# Patient Record
Sex: Female | Born: 1935 | Race: White | Hispanic: No | Marital: Married | Smoking: Never smoker
Health system: Southern US, Community
[De-identification: ages and names within clinical notes are randomized; demographics above are authoritative.]

## PROBLEM LIST (undated history)

## (undated) DIAGNOSIS — E785 Hyperlipidemia, unspecified: Secondary | ICD-10-CM

## (undated) DIAGNOSIS — N3020 Other chronic cystitis without hematuria: Secondary | ICD-10-CM

## (undated) DIAGNOSIS — M1990 Unspecified osteoarthritis, unspecified site: Secondary | ICD-10-CM

## (undated) DIAGNOSIS — K635 Polyp of colon: Secondary | ICD-10-CM

## (undated) DIAGNOSIS — M069 Rheumatoid arthritis, unspecified: Secondary | ICD-10-CM

## (undated) DIAGNOSIS — Z87898 Personal history of other specified conditions: Secondary | ICD-10-CM

## (undated) DIAGNOSIS — M8580 Other specified disorders of bone density and structure, unspecified site: Secondary | ICD-10-CM

## (undated) DIAGNOSIS — T7840XA Allergy, unspecified, initial encounter: Secondary | ICD-10-CM

## (undated) DIAGNOSIS — M542 Cervicalgia: Secondary | ICD-10-CM

## (undated) DIAGNOSIS — K5790 Diverticulosis of intestine, part unspecified, without perforation or abscess without bleeding: Secondary | ICD-10-CM

## (undated) DIAGNOSIS — I10 Essential (primary) hypertension: Secondary | ICD-10-CM

## (undated) DIAGNOSIS — K760 Fatty (change of) liver, not elsewhere classified: Secondary | ICD-10-CM

## (undated) DIAGNOSIS — G5600 Carpal tunnel syndrome, unspecified upper limb: Secondary | ICD-10-CM

## (undated) DIAGNOSIS — H269 Unspecified cataract: Secondary | ICD-10-CM

## (undated) DIAGNOSIS — E119 Type 2 diabetes mellitus without complications: Secondary | ICD-10-CM

## (undated) DIAGNOSIS — G4733 Obstructive sleep apnea (adult) (pediatric): Secondary | ICD-10-CM

## (undated) HISTORY — DX: Polyp of colon: K63.5

## (undated) HISTORY — DX: Cervicalgia: M54.2

## (undated) HISTORY — PX: DILATION AND CURETTAGE OF UTERUS: SHX78

## (undated) HISTORY — DX: Type 2 diabetes mellitus without complications: E11.9

## (undated) HISTORY — PX: ROTATOR CUFF REPAIR: SHX139

## (undated) HISTORY — PX: OTHER SURGICAL HISTORY: SHX169

## (undated) HISTORY — DX: Obstructive sleep apnea (adult) (pediatric): G47.33

## (undated) HISTORY — DX: Other chronic cystitis without hematuria: N30.20

## (undated) HISTORY — PX: BUNIONECTOMY: SHX129

## (undated) HISTORY — DX: Rheumatoid arthritis, unspecified: M06.9

## (undated) HISTORY — DX: Diverticulosis of intestine, part unspecified, without perforation or abscess without bleeding: K57.90

## (undated) HISTORY — DX: Other specified disorders of bone density and structure, unspecified site: M85.80

## (undated) HISTORY — DX: Unspecified cataract: H26.9

## (undated) HISTORY — DX: Hyperlipidemia, unspecified: E78.5

## (undated) HISTORY — DX: Unspecified osteoarthritis, unspecified site: M19.90

## (undated) HISTORY — DX: Personal history of other specified conditions: Z87.898

## (undated) HISTORY — DX: Carpal tunnel syndrome, unspecified upper limb: G56.00

## (undated) HISTORY — PX: ABDOMINAL SURGERY: SHX537

## (undated) HISTORY — DX: Allergy, unspecified, initial encounter: T78.40XA

## (undated) HISTORY — DX: Fatty (change of) liver, not elsewhere classified: K76.0

---

## 1997-11-27 ENCOUNTER — Other Ambulatory Visit: Admission: RE | Admit: 1997-11-27 | Discharge: 1997-11-27 | Payer: Self-pay | Admitting: Internal Medicine

## 1999-02-02 ENCOUNTER — Ambulatory Visit: Admission: RE | Admit: 1999-02-02 | Discharge: 1999-02-02 | Payer: Self-pay | Admitting: Internal Medicine

## 2000-05-20 ENCOUNTER — Emergency Department (HOSPITAL_COMMUNITY): Admission: EM | Admit: 2000-05-20 | Discharge: 2000-05-20 | Payer: Self-pay | Admitting: Emergency Medicine

## 2000-05-20 ENCOUNTER — Encounter: Payer: Self-pay | Admitting: Emergency Medicine

## 2000-08-19 ENCOUNTER — Encounter (INDEPENDENT_AMBULATORY_CARE_PROVIDER_SITE_OTHER): Payer: Self-pay | Admitting: Specialist

## 2000-08-19 ENCOUNTER — Other Ambulatory Visit: Admission: RE | Admit: 2000-08-19 | Discharge: 2000-08-19 | Payer: Self-pay | Admitting: Gastroenterology

## 2000-08-23 ENCOUNTER — Ambulatory Visit (HOSPITAL_COMMUNITY): Admission: RE | Admit: 2000-08-23 | Discharge: 2000-08-23 | Payer: Self-pay | Admitting: Gastroenterology

## 2000-08-23 ENCOUNTER — Encounter: Payer: Self-pay | Admitting: Gastroenterology

## 2002-11-27 ENCOUNTER — Encounter: Payer: Self-pay | Admitting: Emergency Medicine

## 2002-11-27 ENCOUNTER — Emergency Department (HOSPITAL_COMMUNITY): Admission: EM | Admit: 2002-11-27 | Discharge: 2002-11-27 | Payer: Self-pay | Admitting: Emergency Medicine

## 2004-04-25 ENCOUNTER — Encounter: Admission: RE | Admit: 2004-04-25 | Discharge: 2004-04-25 | Payer: Self-pay | Admitting: Internal Medicine

## 2004-05-06 ENCOUNTER — Encounter: Admission: RE | Admit: 2004-05-06 | Discharge: 2004-05-06 | Payer: Self-pay | Admitting: Internal Medicine

## 2004-05-08 ENCOUNTER — Encounter: Admission: RE | Admit: 2004-05-08 | Discharge: 2004-05-08 | Payer: Self-pay | Admitting: Internal Medicine

## 2004-05-19 ENCOUNTER — Encounter: Admission: RE | Admit: 2004-05-19 | Discharge: 2004-05-19 | Payer: Self-pay | Admitting: General Surgery

## 2004-06-05 ENCOUNTER — Inpatient Hospital Stay (HOSPITAL_COMMUNITY): Admission: RE | Admit: 2004-06-05 | Discharge: 2004-06-08 | Payer: Self-pay | Admitting: General Surgery

## 2004-06-05 ENCOUNTER — Encounter (INDEPENDENT_AMBULATORY_CARE_PROVIDER_SITE_OTHER): Payer: Self-pay | Admitting: *Deleted

## 2008-07-13 ENCOUNTER — Encounter (INDEPENDENT_AMBULATORY_CARE_PROVIDER_SITE_OTHER): Payer: Self-pay | Admitting: *Deleted

## 2008-09-11 ENCOUNTER — Ambulatory Visit: Payer: Self-pay | Admitting: Gastroenterology

## 2008-09-25 ENCOUNTER — Encounter: Payer: Self-pay | Admitting: Gastroenterology

## 2008-09-25 ENCOUNTER — Ambulatory Visit: Payer: Self-pay | Admitting: Gastroenterology

## 2008-09-27 ENCOUNTER — Encounter: Payer: Self-pay | Admitting: Gastroenterology

## 2010-03-30 HISTORY — PX: OTHER SURGICAL HISTORY: SHX169

## 2010-07-30 ENCOUNTER — Other Ambulatory Visit (HOSPITAL_COMMUNITY): Payer: Self-pay | Admitting: Radiology

## 2010-07-30 ENCOUNTER — Ambulatory Visit (HOSPITAL_COMMUNITY): Payer: Medicare Other | Attending: Internal Medicine | Admitting: Radiology

## 2010-07-30 DIAGNOSIS — I517 Cardiomegaly: Secondary | ICD-10-CM

## 2010-07-30 DIAGNOSIS — I10 Essential (primary) hypertension: Secondary | ICD-10-CM

## 2010-08-15 NOTE — Discharge Summary (Signed)
Laura Maxwell, Laura Maxwell               ACCOUNT NO.:  [id number]   MEDICAL RECORD NO.:  [id number]          PATIENT TYPE:  INP   LOCATION:  5732                         FACILITY:  [facility]   PHYSICIAN:  Gabrielle Dare. Janee Morn, M.D.DATE OF BIRTH:  09-27-1935   DATE OF ADMISSION:  06/05/2004  DATE OF DISCHARGE:  06/08/2004                                 DISCHARGE SUMMARY   DISCHARGE DIAGNOSIS:  Status post resection of pelvic leiomyoma.   HISTORY OF PRESENT ILLNESS:  Patient is a 75 year old white female who  presented for elective resection of pelvic mass.  This mass had been  diagnosed through work-ups for recurrent urinary tract infections.   HOSPITAL COURSE:  The patient came in and underwent an uncomplicated  resection of a pelvic mass.  Postoperatively she had mild ileus.  This  resolved gradually.  She remained afebrile and hemodynamically stable.  Her  pathology report came back a leiomyoma with no evidence of malignancy.  She  continued to progress well and otherwise had an uncomplicated postoperative  course.  She was discharged home on postoperative day #3.   DISCHARGE DIET:  Regular.   DISCHARGE ACTIVITY:  No lifting.   DISCHARGE MEDICATIONS:  1.  Percocet 5/325 one to two q.6h. as needed for pain.  2.  She is also to continue her home medications of Accupril 40 mg daily.  3.  Hydrochlorothiazide 25 mg daily.  4.  acebutolol 200 mg daily.   FOLLOW-UP:  In my office next week for staple removal.      BET/MEDQ  D:  08/01/2004  T:  08/01/2004  Job:  [id number]

## 2010-08-15 NOTE — Op Note (Signed)
NAME:  Laura Maxwell, Laura Maxwell               ACCOUNT NO.:  [id number]   MEDICAL RECORD NO.:  [id number]          PATIENT TYPE:  INP   LOCATION:  2550                         FACILITY:  [facility]   PHYSICIAN:  Burke E. Thompson, M.D.DATE OF BIRTH:  02/04/1936   DATE OF PROCEDURE:  06/05/2004  DATE OF DISCHARGE:                                 OPERATIVE REPORT   PREOPERATIVE DIAGNOSIS:  Pelvic mass.   POSTOPERATIVE DIAGNOSIS:  Pelvic mass.   PROCEDURE:  Resection of pelvic mass.   SURGEON:  Burke E. Thompson, M.D.   ASSISTANT:  Todd J. Rosenbower, M.D.   ANESTHESIA:  General.   HISTORY OF PRESENT ILLNESS:  The patient is a 75-year-old white female who I  evaluated for a mass in her left pelvis.  This was seen first CAT scan of  the abdomen and pelvis that was obtained for recurring urinary tract  infections.  Further workup included MRI which demonstrated soft tissue mass  within the left hemi pelvis separate from the ovary and of the uterus but  projecting anteriorly from the region of the round ligament.  It was at 7.8  x 5.5 cm. The patient presents for resection of the mass. She did undergo  bowel prep for the surgery.   PROCEDURE IN DETAIL:  Informed consent was obtained, the patient was  identified, she received intravenous antibiotics. She was brought to the  operating room. General anesthesia administered. Her abdomen was prepped and  draped in sterile fashion. A lower midline incision was made.  The  subcutaneous tissues were dissected down revealing the anterior fascia which  was divided.  The posterior fascia was divided and the peritoneal cavity was  entered under direct vision without difficulty.  The fascia was opened to  the length of the incision.  Exploration of the pelvis revealed this 8 cm  mass to be arising from the peritoneum on a proximally 2 cm peritoneal  peduncle with blood was obtained with.  The mass was clamped at its base  after dividing some of filmy portion  of the of the stalk with Bovie cautery  and it was excised in toto and sent to pathology the base was tied with 2-0  silk suture and cauterized.  We had excellent hemostasis.  The abdomen was  then further explored.  The peritoneal surface was palpated and no other  peritoneal nodules were felt.  The liver was smooth on the right and left  sides. The small bowel from ligament of Treitz to the terminal ileum and was  normal.  No other pelvic masses were palpable. No other abnormalities were  noted.  Hemostasis was ensured.  The area was locally irrigated the abdomen  was closed in the following manner. The abdomen was closed as follows. The  fascia was closed with two lengths of running #1 PDS suture starting at each  end of the incision and tied in the middle. Subcutaneous tissues were again  copiously  irrigated. Hemostasis was ensured and the skin was closed with staples.  Sponge, needle and instrument counts were correct. A sterile dressing was  applied.   The patient tolerated the procedure well without apparent  complications and was taken to recovery room in stable condition.      BET/MEDQ  D:  06/05/2004  T:  06/05/2004  Job:  [id number] 

## 2010-08-15 NOTE — Op Note (Signed)
Laura Maxwell, Laura Maxwell               ACCOUNT NO.:  [id number]   MEDICAL RECORD NO.:  [id number]          PATIENT TYPE:  INP   LOCATION:  2550                         FACILITY:  [facility]   PHYSICIAN:  Gabrielle Dare. Janee Morn, M.D.DATE OF BIRTH:  Feb 26, 1936   DATE OF PROCEDURE:  06/05/2004  DATE OF DISCHARGE:                                 OPERATIVE REPORT   PREOPERATIVE DIAGNOSIS:  Pelvic mass.   POSTOPERATIVE DIAGNOSIS:  Pelvic mass.   PROCEDURE:  Resection of pelvic mass.   SURGEON:  Gabrielle Dare. Janee Morn, M.D.   ASSISTANT:  Adolph Pollack, M.D.   ANESTHESIA:  General.   HISTORY OF PRESENT ILLNESS:  The patient is a 75 year old white female who I  evaluated for a mass in her left pelvis.  This was seen first CAT scan of  the abdomen and pelvis that was obtained for recurring urinary tract  infections.  Further workup included MRI which demonstrated soft tissue mass  within the left hemi pelvis separate from the ovary and of the uterus but  projecting anteriorly from the region of the round ligament.  It was at 7.8  x 5.5 cm. The patient presents for resection of the mass. She did undergo  bowel prep for the surgery.   PROCEDURE IN DETAIL:  Informed consent was obtained, the patient was  identified, she received intravenous antibiotics. She was brought to the  operating room. General anesthesia administered. Her abdomen was prepped and  draped in sterile fashion. A lower midline incision was made.  The  subcutaneous tissues were dissected down revealing the anterior fascia which  was divided.  The posterior fascia was divided and the peritoneal cavity was  entered under direct vision without difficulty.  The fascia was opened to  the length of the incision.  Exploration of the pelvis revealed this 8 cm  mass to be arising from the peritoneum on a proximally 2 cm peritoneal  peduncle with blood was obtained with.  The mass was clamped at its base  after dividing some of filmy portion  of the of the stalk with Bovie cautery  and it was excised in toto and sent to pathology the base was tied with 2-0  silk suture and cauterized.  We had excellent hemostasis.  The abdomen was  then further explored.  The peritoneal surface was palpated and no other  peritoneal nodules were felt.  The liver was smooth on the right and left  sides. The small bowel from ligament of Treitz to the terminal ileum and was  normal.  No other pelvic masses were palpable. No other abnormalities were  noted.  Hemostasis was ensured.  The area was locally irrigated the abdomen  was closed in the following manner. The abdomen was closed as follows. The  fascia was closed with two lengths of running #1 PDS suture starting at each  end of the incision and tied in the middle. Subcutaneous tissues were again  copiously  irrigated. Hemostasis was ensured and the skin was closed with staples.  Sponge, needle and instrument counts were correct. A sterile dressing was  applied.  The patient tolerated the procedure well without apparent  complications and was taken to recovery room in stable condition.      BET/MEDQ  D:  06/05/2004  T:  06/05/2004  Job:  [id number]

## 2010-10-07 ENCOUNTER — Encounter (INDEPENDENT_AMBULATORY_CARE_PROVIDER_SITE_OTHER): Payer: Medicare Other

## 2010-10-07 DIAGNOSIS — R002 Palpitations: Secondary | ICD-10-CM

## 2011-10-05 ENCOUNTER — Emergency Department (HOSPITAL_COMMUNITY)
Admission: EM | Admit: 2011-10-05 | Discharge: 2011-10-05 | Disposition: A | Discharge status: Home / Self Care | Payer: Medicare Other | Attending: Emergency Medicine | Admitting: Emergency Medicine

## 2011-10-05 ENCOUNTER — Encounter (HOSPITAL_COMMUNITY): Payer: Self-pay | Admitting: Emergency Medicine

## 2011-10-05 DIAGNOSIS — T783XXA Angioneurotic edema, initial encounter: Secondary | ICD-10-CM

## 2011-10-05 DIAGNOSIS — X58XXXA Exposure to other specified factors, initial encounter: Secondary | ICD-10-CM | POA: Insufficient documentation

## 2011-10-05 DIAGNOSIS — I10 Essential (primary) hypertension: Secondary | ICD-10-CM | POA: Insufficient documentation

## 2011-10-05 DIAGNOSIS — Z79899 Other long term (current) drug therapy: Secondary | ICD-10-CM | POA: Insufficient documentation

## 2011-10-05 HISTORY — DX: Essential (primary) hypertension: I10

## 2011-10-05 MED ORDER — METHYLPREDNISOLONE SODIUM SUCC 125 MG IJ SOLR
INTRAMUSCULAR | Status: AC
  Administered 2011-10-05: 125 mg via INTRAVENOUS
  Filled 2011-10-05: qty 2

## 2011-10-05 MED ORDER — FAMOTIDINE IN NACL 20-0.9 MG/50ML-% IV SOLN
20.0000 mg | Freq: Once | INTRAVENOUS | Status: AC
  Administered 2011-10-05: 20 mg via INTRAVENOUS

## 2011-10-05 MED ORDER — METHYLPREDNISOLONE SODIUM SUCC 125 MG IJ SOLR
125.0000 mg | Freq: Once | INTRAMUSCULAR | Status: AC
  Administered 2011-10-05: 125 mg via INTRAVENOUS

## 2011-10-05 MED ORDER — DIPHENHYDRAMINE HCL 50 MG/ML IJ SOLN
INTRAMUSCULAR | Status: AC
  Administered 2011-10-05: 25 mg via INTRAVENOUS
  Filled 2011-10-05: qty 1

## 2011-10-05 MED ORDER — FAMOTIDINE IN NACL 20-0.9 MG/50ML-% IV SOLN
INTRAVENOUS | Status: AC
  Administered 2011-10-05: 20 mg via INTRAVENOUS
  Filled 2011-10-05: qty 50

## 2011-10-05 MED ORDER — DIPHENHYDRAMINE HCL 50 MG/ML IJ SOLN
25.0000 mg | Freq: Once | INTRAMUSCULAR | Status: AC
  Administered 2011-10-05: 25 mg via INTRAVENOUS

## 2011-10-05 NOTE — ED Notes (Signed)
Pt. States that it feels like her tongue is decreasing in size. md aware.

## 2011-10-05 NOTE — ED Notes (Signed)
I advised the patient and her spouse to let me know immediately if her swelling gets worse, if she has trouble handling her saliva, or if she feels her throat closing.

## 2011-10-05 NOTE — ED Notes (Signed)
Dr. Norlene Campbell and RN informed of pt roomed from Nurse first r/t potential for airway obstruction from angioedema of tongue

## 2011-10-05 NOTE — ED Provider Notes (Signed)
11:52 AM Patient states that she is substantially improved.  No e/o airway compromise.  D/C home w instructions to stop ACE inhibitor.  Gerhard Munch, MD 10/05/11 1152

## 2011-10-05 NOTE — ED Notes (Signed)
Family at bedside. 

## 2011-10-05 NOTE — ED Notes (Signed)
MD at bedside. 

## 2011-10-05 NOTE — ED Notes (Addendum)
Patient reports gradual swelling of the tongue and face that started after she took her nighttime medication dose.  Obvious swelling to tongue noted; airway clear.  No respiratory issues at this time; patient reports difficulty swallowing.  RN at bedside starting IV.

## 2011-10-05 NOTE — ED Provider Notes (Signed)
History     CSN: [id number]  Arrival date & time 10/05/11  0040   First MD Initiated Contact with Patient 10/05/11 [phone]      Chief Complaint  Patient presents with  . Allergic Reaction  . Angioedema    (Consider location/radiation/quality/duration/timing/severity/associated sxs/prior treatment) HPI 76 year old female presents to emergency room complaining of tongue swelling. Patient reports she was taking her nighttime dosing when she noticed swelling to left side of her tongue. Since onset, she has had increasing swelling. She denies any shortness of breath, difficulties with her oral secretions,, gagging, or other complaints. No prior history of age edema. Patient is on ACE inhibitor, which she's been on for years.  Past Medical History  Diagnosis Date  . Hypertension     History reviewed. No pertinent past surgical history.  History reviewed. No pertinent family history.  History  Substance Use Topics  . Smoking status: Never Smoker   . Smokeless tobacco: Not on file  . Alcohol Use: No    OB History    Grav Para Term Preterm Abortions TAB SAB Ect Mult Living                  Review of Systems  All other systems reviewed and are negative.    Allergies  Ace inhibitors and Azo  Home Medications   Current Outpatient Rx  Name Route Sig Dispense Refill  . ACEBUTOLOL HCL PO Oral Take 1 capsule by mouth 3 (three) times daily.    Marland Kitchen CALCIUM PO Oral Take 1 tablet by mouth daily.    Marland Kitchen HYDROCHLOROTHIAZIDE 25 MG PO TABS Oral Take 25 mg by mouth daily.    . QUINAPRIL HCL 40 MG PO TABS Oral Take 40 mg by mouth daily.    Marland Kitchen ROSUVASTATIN CALCIUM 5 MG PO TABS Oral Take 5 mg by mouth daily.    . OMEGA 3 PO Oral Take 1 capsule by mouth daily.      BP 171/84  Pulse 80  Temp 97.6 F (36.4 C) (Oral)  Resp 15  SpO2 95%  Physical Exam  Nursing note and vitals reviewed. Constitutional: She is oriented to person, place, and time. She appears well-developed and  well-nourished.  HENT:  Head: Normocephalic and atraumatic.  Nose: Nose normal.       angioedema of the tongue noted. Swelling to tip and left half of the tongue without obstruction of the airway. Patient able to swallow. No stridor noted.  Eyes: Conjunctivae and EOM are normal. Pupils are equal, round, and reactive to light.  Neck: Normal range of motion. Neck supple. No JVD present. No tracheal deviation present. No thyromegaly present.  Cardiovascular: Normal rate, regular rhythm, normal heart sounds and intact distal pulses.  Exam reveals no gallop and no friction rub.   No murmur heard. Pulmonary/Chest: Effort normal and breath sounds normal. No stridor. No respiratory distress. She has no wheezes. She has no rales. She exhibits no tenderness.  Abdominal: Soft. Bowel sounds are normal. She exhibits no distension and no mass. There is no tenderness. There is no rebound and no guarding.  Musculoskeletal: Normal range of motion. She exhibits no edema and no tenderness.  Lymphadenopathy:    She has no cervical adenopathy.  Neurological: She is oriented to person, place, and time. She exhibits normal muscle tone. Coordination normal.  Skin: Skin is dry. No rash noted. No erythema. No pallor.  Psychiatric: She has a normal mood and affect. Her behavior is normal. Judgment and thought content  normal.    ED Course  Procedures (including critical care time)  Labs Reviewed - No data to display No results found. CRITICAL CARE Performed by: Olivia Mackie   Total critical care time: 70  Critical care time was exclusive of separately billable procedures and treating other patients.  Critical care was necessary to treat or prevent imminent or life-threatening deterioration.  Critical care was time spent personally by me on the following activities: development of treatment plan with patient and/or surrogate as well as nursing, discussions with consultants, evaluation of patient's response to  treatment, examination of patient, obtaining history from patient or surrogate, ordering and performing treatments and interventions, ordering and review of laboratory studies, ordering and review of radiographic studies, pulse oximetry and re-evaluation of patient's condition.  1. Angioedema       MDM  76 year old female with angioedema most likely due to to ACE inhibitor. After presentation with swelling to left half of tongue, patient developed swelling to the entire tongue. Swelling did progress, and initial view of oropharynx was blocked to just the most appear portion of the uvula. At no point has patient had difficulties handling secretions, shortness of breath stridor or other signs of impending airway obstruction. Patient has been closely monitored during her stay in the emergency department with frequent checks. Patient has been instructed to push the call bell should she have any worsening of her symptoms. At time of end of my shift, patient's tongue has begun to decrease in size slightly. Care passed to Dr. Jeraldine Loots awaiting continued improvement        Olivia Mackie, MD 10/05/11 [phone]

## 2012-03-10 ENCOUNTER — Other Ambulatory Visit: Payer: Self-pay | Admitting: Dermatology

## 2012-08-09 ENCOUNTER — Other Ambulatory Visit: Payer: Self-pay | Admitting: Obstetrics and Gynecology

## 2013-01-20 ENCOUNTER — Encounter (HOSPITAL_COMMUNITY): Payer: Self-pay | Admitting: Emergency Medicine

## 2013-01-20 ENCOUNTER — Observation Stay (HOSPITAL_COMMUNITY)
Admission: EM | Admit: 2013-01-20 | Discharge: 2013-01-20 | Disposition: A | Discharge status: Home / Self Care | Payer: Medicare PPO | Attending: Internal Medicine | Admitting: Internal Medicine

## 2013-01-20 DIAGNOSIS — T783XXA Angioneurotic edema, initial encounter: Principal | ICD-10-CM

## 2013-01-20 DIAGNOSIS — I10 Essential (primary) hypertension: Secondary | ICD-10-CM | POA: Insufficient documentation

## 2013-01-20 DIAGNOSIS — X58XXXA Exposure to other specified factors, initial encounter: Secondary | ICD-10-CM | POA: Insufficient documentation

## 2013-01-20 DIAGNOSIS — Z79899 Other long term (current) drug therapy: Secondary | ICD-10-CM | POA: Insufficient documentation

## 2013-01-20 LAB — MRSA PCR SCREENING: MRSA by PCR: NEGATIVE

## 2013-01-20 LAB — POCT I-STAT, CHEM 8
Calcium, Ion: 1.23 mmol/L (ref 1.13–1.30)
Chloride: 104 mEq/L (ref 96–112)
Creatinine, Ser: 1.2 mg/dL — ABNORMAL HIGH (ref 0.50–1.10)
Potassium: 3.8 mEq/L (ref 3.5–5.1)
TCO2: 23 mmol/L (ref 0–100)

## 2013-01-20 LAB — CBC
HCT: 42.4 % (ref 36.0–46.0)
Hemoglobin: 14.4 g/dL (ref 12.0–15.0)
MCV: 93.8 fL (ref 78.0–100.0)
WBC: 10.3 10*3/uL (ref 4.0–10.5)

## 2013-01-20 LAB — IGE: IgE (Immunoglobulin E), Serum: 8.6 IU/mL (ref 0.0–180.0)

## 2013-01-20 MED ORDER — METHYLPREDNISOLONE SODIUM SUCC 125 MG IJ SOLR
60.0000 mg | Freq: Four times a day (QID) | INTRAMUSCULAR | Status: AC
  Administered 2013-01-20 (×2): 60 mg via INTRAVENOUS
  Filled 2013-01-20: qty 2
  Filled 2013-01-20 (×4): qty 0.96

## 2013-01-20 MED ORDER — METHYLPREDNISOLONE SODIUM SUCC 125 MG IJ SOLR
125.0000 mg | Freq: Once | INTRAMUSCULAR | Status: AC
  Administered 2013-01-20: 125 mg via INTRAVENOUS
  Filled 2013-01-20: qty 2

## 2013-01-20 MED ORDER — EPINEPHRINE 0.3 MG/0.3ML IJ SOAJ: 0.3000 mg | Freq: Once | INTRAMUSCULAR | Status: DC

## 2013-01-20 MED ORDER — DIPHENHYDRAMINE HCL 25 MG PO CAPS: 25.0000 mg | ORAL_CAPSULE | Freq: Four times a day (QID) | ORAL | Status: DC | PRN

## 2013-01-20 MED ORDER — INFLUENZA VAC SPLIT QUAD 0.5 ML IM SUSP
0.5000 mL | Freq: Once | INTRAMUSCULAR | Status: DC
  Filled 2013-01-20: qty 0.5

## 2013-01-20 MED ORDER — FAMOTIDINE 20 MG PO TABS
20.0000 mg | ORAL_TABLET | Freq: Once | ORAL | Status: AC
  Administered 2013-01-20: 20 mg via ORAL
  Filled 2013-01-20: qty 1

## 2013-01-20 MED ORDER — LABETALOL HCL 300 MG PO TABS
300.0000 mg | ORAL_TABLET | Freq: Three times a day (TID) | ORAL | Status: DC
  Administered 2013-01-20 (×2): 300 mg via ORAL
  Filled 2013-01-20 (×4): qty 1

## 2013-01-20 MED ORDER — FAMOTIDINE 20 MG PO TABS: 20.0000 mg | ORAL_TABLET | Freq: Every day | ORAL | Status: DC

## 2013-01-20 MED ORDER — DIPHENHYDRAMINE HCL 50 MG/ML IJ SOLN: 25.0000 mg | Freq: Four times a day (QID) | INTRAMUSCULAR | Status: DC | PRN

## 2013-01-20 MED ORDER — AMLODIPINE BESYLATE 5 MG PO TABS
5.0000 mg | ORAL_TABLET | Freq: Every day | ORAL | Status: DC
Start: 2013-01-20 — End: 2013-01-20
  Administered 2013-01-20: 5 mg via ORAL
  Filled 2013-01-20 (×2): qty 1

## 2013-01-20 MED ORDER — ENOXAPARIN SODIUM 40 MG/0.4ML ~~LOC~~ SOLN
40.0000 mg | SUBCUTANEOUS | Status: DC
  Administered 2013-01-20: 40 mg via SUBCUTANEOUS
  Filled 2013-01-20 (×3): qty 0.4

## 2013-01-20 MED ORDER — FAMOTIDINE IN NACL 20-0.9 MG/50ML-% IV SOLN
20.0000 mg | Freq: Two times a day (BID) | INTRAVENOUS | Status: DC
  Filled 2013-01-20: qty 50

## 2013-01-20 MED ORDER — INFLUENZA VAC SPLIT QUAD 0.5 ML IM SUSP: 0.5000 mL | INTRAMUSCULAR | Status: DC

## 2013-01-20 NOTE — ED Notes (Signed)
Pt. woke up this morning with tongue swelling / airway and respirations unlabored at arrival . Denies pain . No new medications or any new food intake .

## 2013-01-20 NOTE — ED Notes (Signed)
Family at bedside. 

## 2013-01-20 NOTE — Progress Notes (Signed)
I called her and she is sounding completely normal and she says her tongue swelling is way down. i will see her after work and re-eval.

## 2013-01-20 NOTE — H&P (Addendum)
Laura Maxwell is an 77 y.o. female.   PCP:   Laura Pounds, MD   Chief Complaint:  Angioedema  HPI: [name] c known ACEi Angioedema - has been off Lisinopril for awhile presents c tongue swelling on awakening this am.  No airway issues.  Mild to Mod swallowing issues due to tongue size.  No new meds or chemicals.  No shrimp. She did have a Snickers ice cream last night - Peanuts +.  Tongue swelling is R sided.  Lower lip mildly swollen. Mild Throat sensations.  No Hives, stridor, or wheezing. She took 3 Benadryl prior to arrival and had solumedrol, pepcid in ED. Called IM for admission. No SOB or CP or other issues. She reports that one day last week she had > 12 hrs of Upper lip swelling - Minor - Took Bendryl and slowly improved. Has been on Trimethoprim from Laura Maxwell at [facility] for @ one month to avoid UTIs and scheduled to stay on until Dec.  Re-eval 20 min after first eval and speech is less garbled - tongue still swollen but seems less?  No recent Sting bite per insect.  2-3 months of leg itching but improving.   Past Medical History:  Past Medical History  Diagnosis Date  . Hypertension    Hypertension,  Hyperlipidemia,  IFG,  Palpitations,   Recurrent colon polyps,   Diverticulosis,   Mild OSA,  Osteopenia,  hyperglycemia/IFG,  CTS,  OA,  Obesity,  Chronic Cystitis/ Painless Micro-Hematuria - Dr Laura Maxwell  - 2000 Renal US IVP and Cysto all (-) 2007 CT and Cytology (-),  Mult UTIs - On trimethoprim for @ 1 month,  Fatty Liver,  Neck Aches/HA/Scalp Issues,  H/O Vertigo  October 05, 2011 Angioedema to ACEi - presumed.   Past Surgical History  Procedure Laterality Date  . Abdominal surgery    . Dilation and curettage of uterus    . Rotator cuff repair        Allergies:   Allergies  Allergen Reactions  . Ace Inhibitors Swelling    The patient experienced angioedema after taking quinapril.  She states that she had been taking this medicine "for years" without any side effects/adverse  reactions.  This may also have been caused by the Azo she took.  Laura Maxwell [Phenazopyridine] Swelling    The patient started taking this the week before her reaction.  Her last dose was 4 days prior to the angioedema.  This may also have been cause be her ace inhibitor.     Medications: Prior to Admission medications   Medication Sig Start Date End Date Taking? Authorizing Provider  amLODipine (NORVASC) 5 MG tablet Take 5 mg by mouth daily.   Yes Historical Provider, MD  aspirin EC 81 MG tablet Take 81 mg by mouth daily.   Yes Historical Provider, MD  Coenzyme Q10 (COQ10 PO) Take 1 tablet by mouth daily.   Yes Historical Provider, MD  GARLIC PO Take 1 tablet by mouth daily.   Yes Historical Provider, MD  labetalol (NORMODYNE) 300 MG tablet Take 300 mg by mouth 3 (three) times daily.   Yes Historical Provider, MD  Multiple Vitamin (MULTIVITAMIN WITH MINERALS) TABS tablet Take 1 tablet by mouth daily.   Yes Historical Provider, MD  Probiotic Product (ALIGN PO) Take 1 tablet by mouth daily.   Yes Historical Provider, MD  rosuvastatin (CRESTOR) 5 MG tablet Take 5 mg by mouth daily.   Yes Historical Provider, MD  spironolactone (ALDACTONE) 25 MG  tablet Take 25 mg by mouth daily.  01/02/13  Yes Historical Provider, MD  trimethoprim (TRIMPEX) 100 MG tablet Take 100 mg by mouth daily.  01/13/13  Yes Historical Provider, MD  VITAMIN E PO Take 1 tablet by mouth daily.   Yes Historical Provider, MD      (Not in a hospital admission)   Social History:  reports that she has never smoked. She does not have any smokeless tobacco history on file. She reports that she does not drink alcohol or use illicit drugs.  Family History: No family history on file.  Review of Systems:  Review of Systems - See HPI. All Systems reviewed.   Physical Exam:  Blood pressure 137/73, pulse 69, temperature 98 F (36.7 C), temperature source Oral, resp. rate 14, SpO2 97.00%. Filed Vitals:   01/20/13 0333 01/20/13  0500 01/20/13 0613  BP: 194/94 152/76 137/73  Pulse: 91 73 69  Temp: 98 F (36.7 C)    TempSrc: Oral    Resp: 16 14 14   SpO2: 96% 97% 97%   General appearance: Tired but looks  Head: Normocephalic, without obvious abnormality, atraumatic Eyes: conjunctivae/corneas clear. PERRL, EOM's intact.  Nose: Nares normal. Septum midline. Mucosa normal. No drainage or sinus tenderness. Throat: lips and tongue swollen and large.  Uvula midline. Airway intact Neck: no adenopathy, no carotid bruit, no JVD and thyroid not enlarged, symmetric, no tenderness/mass/nodules Resp: CTA B Cardio: Regular GI: soft, non-tender; bowel sounds normal; no masses,  no organomegaly Extremities: extremities normal, atraumatic, no cyanosis or edema Pulses: 2+ and symmetric Lymph nodes:  no cervical lymphadenopathy Skin - No Hives or issues. Neurologic: Alert and oriented X 3, normal strength and tone. Normal symmetric reflexes.     Labs on Admission:   Recent Labs  01/20/13 0403  NA 139  K 3.8  CL 104  GLUCOSE 153*  BUN 15  CREATININE 1.20*   No results found for this basename: AST, ALT, ALKPHOS, BILITOT, PROT, ALBUMIN,  in the last 72 hours No results found for this basename: LIPASE, AMYLASE,  in the last 72 hours  Recent Labs  01/20/13 0355 01/20/13 0403  WBC 10.3  --   HGB 14.4 14.3  HCT 42.4 42.0  MCV 93.8  --   PLT 236  --    No results found for this basename: CKTOTAL, CKMB, CKMBINDEX, TROPONINI,  in the last 72 hours No results found for this basename: INR,  PROTIME     LAB RESULT POCT:  Results for orders placed during the hospital encounter of 01/20/13  CBC      Result Value Range   WBC 10.3  4.0 - 10.5 K/uL   RBC 4.52  3.87 - 5.11 MIL/uL   Hemoglobin 14.4  12.0 - 15.0 g/dL   HCT 09.8  11.9 - 14.7 %   MCV 93.8  78.0 - 100.0 fL   MCH 31.9  26.0 - 34.0 pg   MCHC 34.0  30.0 - 36.0 g/dL   RDW 82.9  56.2 - 13.0 %   Platelets 236  150 - 400 K/uL  POCT I-STAT, CHEM 8       Result Value Range   Sodium 139  135 - 145 mEq/L   Potassium 3.8  3.5 - 5.1 mEq/L   Chloride 104  96 - 112 mEq/L   BUN 15  6 - 23 mg/dL   Creatinine, Ser 8.65 (*) 0.50 - 1.10 mg/dL   Glucose, Bld 784 (*) 70 - 99 mg/dL  Calcium, Ion 1.23  1.13 - 1.30 mmol/L   TCO2 23  0 - 100 mmol/L   Hemoglobin 14.3  12.0 - 15.0 g/dL   HCT 40.9  81.1 - 91.4 %      Radiological Exams on Admission: No results found.    Orders placed in visit on 10/07/10  . HOLTER MONITOR - 48 HOUR     Assessment/Plan Principal Problem:   Angioedema Active Problems:   HTN (hypertension)  Will admit and Observe Laura Maxwell for Angioedema.  She is S/P a Snickers Ice cream last night.  ACEi/Lisinopril is long gone. Trimethoprim recent?  She is S/p IVF, Solumedrol, Benedryl, and Pepcid.  No current improvement.  Airway currently intact.  Will put in stepdown and monitor carefully.  I have called CCM to help keep airway protected.  May need ENT eval inpt. May need Allergist outpatient.  Check IgE, Complement C2 & C4, and tryptase Levels. Low Threshold for EPI.  Most of her meds - Labetalol and Norvasc are inert.  Hold ASA/crestor/Aldactone and supplements.  Plan discussed c pt. She may already be improving slightly?   Aspin Palomarez M 01/20/2013, 7:40 AM

## 2013-01-20 NOTE — Progress Notes (Signed)
Pt discharged per MD orders. All discharge instructions reviewed and all questions answered.

## 2013-01-20 NOTE — Discharge Summary (Signed)
Physician Discharge Summary  DISCHARGE SUMMARY   Patient ID: Laura Maxwell [facility] MR#: [id number] DOB/AGE: 04/26/1935 77 y.o.   Attending Physician:Sukhman Martine M  Patient's WUJ:WJXBJ,YNWG M, MD  Consults:  none.  Admit date: 01/20/2013 Discharge date: 01/20/2013  Discharge Diagnoses:  Principal Problem:   Angioedema Active Problems:   HTN (hypertension)   Patient Active Problem List   Diagnosis Date Noted  . Angioedema 01/20/2013  . HTN (hypertension) 01/20/2013   Past Medical History  Diagnosis Date  . Hypertension     Discharged Condition: stable  Discharge Medications:   Medication List    STOP taking these medications       ALIGN PO     aspirin EC 81 MG tablet     COQ10 PO     GARLIC PO     multivitamin with minerals Tabs tablet     spironolactone 25 MG tablet  Commonly known as:  ALDACTONE     trimethoprim 100 MG tablet  Commonly known as:  TRIMPEX     VITAMIN E PO      TAKE these medications       amLODipine 5 MG tablet  Commonly known as:  NORVASC  Take 5 mg by mouth daily.     diphenhydrAMINE 25 mg capsule  Commonly known as:  BENADRYL  Take 1 capsule (25 mg total) by mouth every 6 (six) hours as needed for itching or allergies (angioedema).     EPINEPHrine 0.3 mg/0.3 mL Soaj injection  Commonly known as:  EPI-PEN  Inject 0.3 mLs (0.3 mg total) into the muscle once.     famotidine 20 MG tablet  Commonly known as:  PEPCID  Take 1 tablet (20 mg total) by mouth daily.  Start taking on:  01/21/2013     labetalol 300 MG tablet  Commonly known as:  NORMODYNE  Take 300 mg by mouth 3 (three) times daily.     rosuvastatin 5 MG tablet  Commonly known as:  CRESTOR  Take 5 mg by mouth daily.        Hospital Procedures: No results found.  History of Present Illness: 77 F c known ACEi Angioedema - has been off Lisinopril for awhile presents c tongue swelling on awakening this am. No airway issues. Mild to Mod swallowing issues due to  tongue size. No new meds or chemicals. No shrimp. She did have a Snickers ice cream last night - Peanuts +. Tongue swelling is R sided. Lower lip mildly swollen. Mild Throat sensations. No Hives, stridor, or wheezing. She took 3 Benadryl prior to arrival and had solumedrol, pepcid in ED. Called IM for admission.  No SOB or CP or other issues.  She reports that one day last week she had > 12 hrs of Upper lip swelling - Minor - Took Bendryl and slowly improved.  Has been on Trimethoprim from nadine at [facility] for @ one month to avoid UTIs and scheduled to stay on until Dec.  Re-eval 20 min after first eval and speech is less garbled - tongue still swollen but seems less?  No recent Sting bite per insect.  2-3 months of leg itching but improving.   Hospital Course: Admitted.  Given IV solumedrol, IV Pepcid, oral benedryl and monitored in stepdown. Sxs started improving in ED and by 11-12 noon she was better. She was observed until 5pm+ and she has made 100% recovery and can go home off the trimethoprim, off ASA. No Nuts until seen by Allergist I will refer to  Dr Irena Cords as outpt.  Angioedema. She is S/P a Snickers Ice cream last night. ACEi/Lisinopril is long gone. Trimethoprim recently started in Sept by URO and ? Cause.  She is S/p IVF, Solumedrol, Benedryl, and Pepcid. Airway remained intact.  Never needed CCM or ENT.  We sent labs for IgE, Complement C2 & C4, and tryptase Levels.I rxed EPI.  Most of her meds - Labetalol and Norvasc are inert. Hold ASA/crestor/Aldactone and supplements. Plan discussed c pt.   Will d/c and instructed what to do if it comes back.  She is back to nml- Lips and tongue are normal. Speech is great. No C/O  Her BP is erratic and we are probably going to restart Aldactone soon.  Day of Discharge Exam BP 142/83  Pulse 94  Temp(Src) 97.9 F (36.6 C) (Oral)  Resp 16  Ht 5\' 5"  (1.651 m)  Wt 91.4 kg (201 lb 8 oz)  BMI 33.53 kg/m2  SpO2 95%  Physical  Exam: Lip/Mouth back to nml.  Discharge Labs:  Recent Labs  01/20/13 0403  NA 139  K 3.8  CL 104  GLUCOSE 153*  BUN 15  CREATININE 1.20*   No results found for this basename: AST, ALT, ALKPHOS, BILITOT, PROT, ALBUMIN,  in the last 72 hours  Recent Labs  01/20/13 0355 01/20/13 0403  WBC 10.3  --   HGB 14.4 14.3  HCT 42.4 42.0  MCV 93.8  --   PLT 236  --    No results found for this basename: CKTOTAL, CKMB, CKMBINDEX, TROPONINI,  in the last 72 hours No results found for this basename: TSH, T4TOTAL, FREET3, T3FREE, THYROIDAB,  in the last 72 hours No results found for this basename: VITAMINB12, FOLATE, FERRITIN, TIBC, IRON, RETICCTPCT,  in the last 72 hours No results found for this basename: INR, PROTIME       Discharge instructions:  01-Home or Self Care    Disposition: home  Follow-up Appts: Follow-up with Dr. Timothy Lasso at [facility] in 1-2 weeks.  Call for appointment. Dr Irena Cords soon.  Condition on Discharge:  stable  Tests Needing Follow-up: BP  Time spent in discharge (includes decision making & examination of pt): 20 min  Signed: Sabryna Lahm M 01/20/2013, 5:18 PM

## 2013-01-20 NOTE — Progress Notes (Signed)
Utilization review completed.  

## 2013-01-20 NOTE — ED Provider Notes (Signed)
CSN: [id number]     Arrival date & time 01/20/13  4098 History   First MD Initiated Contact with Patient 01/20/13 0330     Chief Complaint  Patient presents with  . Angioedema   (Consider location/radiation/quality/duration/timing/severity/associated sxs/prior Treatment) HPI History provided by patient. Has a history of angioedema from ACE inhibitors, no longer on Lisinopril. Tonight woke up with right-sided tongue swelling. Symptoms moderate in severity with some mild sensation in her throat. No stridor or wheezing or difficulty breathing. Brought in by her husband. No new medications. No known family history of angioedema. No new exposures otherwise. She took 3 Benadryl prior to arrival. Past Medical History  Diagnosis Date  . Hypertension    Past Surgical History  Procedure Laterality Date  . Abdominal surgery    . Dilation and curettage of uterus    . Rotator cuff repair     No family history on file. History  Substance Use Topics  . Smoking status: Never Smoker   . Smokeless tobacco: Not on file  . Alcohol Use: No   OB History   Grav Para Term Preterm Abortions TAB SAB Ect Mult Living                 Review of Systems  Constitutional: Negative for fever and chills.  HENT: Negative for trouble swallowing and voice change.   Respiratory: Negative for shortness of breath.   Cardiovascular: Negative for chest pain.  Gastrointestinal: Negative for abdominal pain.  Genitourinary: Negative for dysuria.  Musculoskeletal: Negative for back pain, neck pain and neck stiffness.  Skin: Negative for rash.  Neurological: Negative for headaches.  All other systems reviewed and are negative.    Allergies  Ace inhibitors and Azo  Home Medications   Current Outpatient Rx  Name  Route  Sig  Dispense  Refill  . aspirin EC 81 MG tablet   Oral   Take 81 mg by mouth daily.         . Coenzyme Q10 (COQ10 PO)   Oral   Take 1 tablet by mouth daily.         Marland Kitchen labetalol  (NORMODYNE) 300 MG tablet   Oral   Take 300 mg by mouth 3 (three) times daily.         . Multiple Vitamin (MULTIVITAMIN WITH MINERALS) TABS tablet   Oral   Take 1 tablet by mouth daily.         . Probiotic Product (ALIGN PO)   Oral   Take 1 tablet by mouth daily.         . rosuvastatin (CRESTOR) 5 MG tablet   Oral   Take 5 mg by mouth daily.         Marland Kitchen spironolactone (ALDACTONE) 25 MG tablet   Oral   Take 25 mg by mouth daily.          Marland Kitchen trimethoprim (TRIMPEX) 100 MG tablet   Oral   Take 100 mg by mouth daily.          Marland Kitchen VITAMIN E PO   Oral   Take 1 tablet by mouth daily.          BP 194/94  Pulse 91  Temp(Src) 98 F (36.7 C) (Oral)  Resp 16  SpO2 96% Physical Exam  Nursing note and vitals reviewed. Constitutional: She is oriented to person, place, and time. She appears well-developed and well-nourished.  HENT:  Head: Normocephalic and atraumatic.  Right sided lingual angioedema. Uvula midline.  No intraoral edema appreciated otherwise  Eyes: EOM are normal. Pupils are equal, round, and reactive to light.  Neck: Neck supple. No tracheal deviation present. No thyromegaly present.  Cardiovascular: Normal rate, normal heart sounds and intact distal pulses.   Pulmonary/Chest: Effort normal and breath sounds normal. No stridor. No respiratory distress.  Musculoskeletal: Normal range of motion. She exhibits no edema and no tenderness.  Neurological: She is alert and oriented to person, place, and time.  Skin: Skin is warm and dry.    ED Course  Procedures (including critical care time) Labs Review Labs Reviewed  POCT I-STAT, CHEM 8 - Abnormal; Notable for the following:    Creatinine, Ser 1.20 (*)    Glucose, Bld 153 (*)    All other components within normal limits  CBC   IV Solu-Medrol. Pepcid provided. Benadryl prior to arrival.  4:50 AM medication list reviewed with pharmacist and Crestor and aspirin for possible causes of her angioedema.      5:24 AM PT feels relatively the same - no change in symptoms, d/w GMA on call, will evaluate in ER  MDM  Dx: Angioedema  Labs obtained Medications provided Serial evaluations MED admit   Sunnie Nielsen, MD 01/21/13 [phone]

## 2013-01-21 LAB — C4 COMPLEMENT: Complement C4, Body Fluid: 35 mg/dL (ref 10–40)

## 2013-01-27 LAB — C2 COMPLEMENT: C2 Complement: 3 mg/dL (ref 1.6–3.5)

## 2013-07-06 ENCOUNTER — Encounter: Payer: Self-pay | Admitting: Gastroenterology

## 2013-07-17 ENCOUNTER — Encounter: Payer: Self-pay | Admitting: Gastroenterology

## 2013-08-29 ENCOUNTER — Ambulatory Visit (AMBULATORY_SURGERY_CENTER): Payer: Self-pay

## 2013-08-29 VITALS — Ht 65.5 in | Wt 194.0 lb

## 2013-08-29 DIAGNOSIS — Z86010 Personal history of colonic polyps: Secondary | ICD-10-CM

## 2013-08-29 DIAGNOSIS — Z800 Family history of malignant neoplasm of digestive organs: Secondary | ICD-10-CM

## 2013-08-29 MED ORDER — MOVIPREP 100 G PO SOLR: ORAL | Status: DC

## 2013-08-29 NOTE — Progress Notes (Signed)
Per pt, no allergies to soy or egg products.Pt not taking any weight loss meds or using  O2 at home. 

## 2013-09-19 ENCOUNTER — Encounter: Payer: Self-pay | Admitting: Gastroenterology

## 2013-09-19 ENCOUNTER — Ambulatory Visit (AMBULATORY_SURGERY_CENTER): Payer: Medicare PPO | Admitting: Gastroenterology

## 2013-09-19 VITALS — BP 120/69 | HR 65 | Temp 96.1°F | Resp 16 | Ht 65.0 in | Wt 194.0 lb

## 2013-09-19 DIAGNOSIS — D126 Benign neoplasm of colon, unspecified: Secondary | ICD-10-CM

## 2013-09-19 DIAGNOSIS — Z86010 Personal history of colonic polyps: Secondary | ICD-10-CM

## 2013-09-19 DIAGNOSIS — D122 Benign neoplasm of ascending colon: Secondary | ICD-10-CM

## 2013-09-19 DIAGNOSIS — Z800 Family history of malignant neoplasm of digestive organs: Secondary | ICD-10-CM

## 2013-09-19 MED ORDER — SODIUM CHLORIDE 0.9 % IV SOLN
500.0000 mL | INTRAVENOUS | Status: DC
Start: 2013-09-19 — End: 2013-09-19

## 2013-09-19 NOTE — Progress Notes (Signed)
Report to PACU, RN, vss, BBS= Clear.  

## 2013-09-19 NOTE — Op Note (Signed)
[facility]  [address] [place], [id number]   COLONOSCOPY PROCEDURE REPORT  PATIENT: Laura, Maxwell  MR#: [id number] BIRTHDATE: 1935-12-03 , 78  yrs. old GENDER: Female ENDOSCOPIST: Ladene Artist, MD, [facility] PROCEDURE DATE:  09/19/2013 PROCEDURE:   Colonoscopy with snare polypectomy First Screening Colonoscopy - Avg.  risk and is 50 yrs.  old or older - No.  Prior Negative Screening - Now for repeat screening. N/A  History of Adenoma - Now for follow-up colonoscopy & has been > or = to 3 yrs.  Yes hx of adenoma.  Has been 3 or more years since last colonoscopy.  Polyps Removed Today? Yes. ASA CLASS:   Class II INDICATIONS:Patient's personal history of adenomatous colon polyps and  Patient's immediate family history of colon cancer. MEDICATIONS: MAC sedation, administered by CRNA and propofol (Diprivan) 180mg  IV DESCRIPTION OF PROCEDURE:   After the risks benefits and alternatives of the procedure were thoroughly explained, informed consent was obtained.  A digital rectal exam revealed no abnormalities of the rectum.   The LB [id number]  endoscope was introduced through the anus and advanced to the cecum, which was identified by both the appendix and ileocecal valve. No adverse events experienced.   The quality of the prep was excellent, using MoviPrep  The instrument was then slowly withdrawn as the colon was fully examined.  COLON FINDINGS: A sessile polyp measuring 5 mm in size was found in the ascending colon.  A polypectomy was performed with a cold snare.  The resection was complete and the polyp tissue was completely retrieved.   Moderate diverticulosis was noted in the sigmoid colon.   The colon was otherwise normal.  There was no diverticulosis, inflammation, polyps or cancers unless previously stated.  Retroflexed views revealed small internal hemorrhoids. The time to cecum=2 minutes 57 seconds.  Withdrawal time=9 minutes 27 seconds.  The  scope was withdrawn and the procedure completed. COMPLICATIONS: There were no complications.  ENDOSCOPIC IMPRESSION: 1.   Sessile polyp measuring 5 mm in the ascending colon; polypectomy performed with a cold snare 2.   Moderate diverticulosis in the sigmoid colon 3.   Small internal hemorrhoids  RECOMMENDATIONS: 1.  Await pathology results 2.  High fiber diet with liberal fluid intake. 3.  Repeat Colonoscopy in 5 years.  eSigned:  Ladene Artist, MD, Marval Regal 09/19/2013 10:10 AM   cc: Shon Baton, MD

## 2013-09-19 NOTE — Patient Instructions (Signed)
YOU HAD AN ENDOSCOPIC PROCEDURE TODAY AT THE [facility]: Refer to the procedure report that was given to you for any specific questions about what was found during the examination.  If the procedure report does not answer your questions, please call your gastroenterologist to clarify.  If you requested that your care partner not be given the details of your procedure findings, then the procedure report has been included in a sealed envelope for you to review at your convenience later.  YOU SHOULD EXPECT: Some feelings of bloating in the abdomen. Passage of more gas than usual.  Walking can help get rid of the air that was put into your GI tract during the procedure and reduce the bloating. If you had a lower endoscopy (such as a colonoscopy or flexible sigmoidoscopy) you may notice spotting of blood in your stool or on the toilet paper. If you underwent a bowel prep for your procedure, then you may not have a normal bowel movement for a few days.  DIET: Your first meal following the procedure should be a light meal and then it is ok to progress to your normal diet.  A half-sandwich or bowl of soup is an example of a good first meal.  Heavy or fried foods are harder to digest and may make you feel nauseous or bloated.  Likewise meals heavy in dairy and vegetables can cause extra gas to form and this can also increase the bloating.  Drink plenty of fluids but you should avoid alcoholic beverages for 24 hours.  ACTIVITY: Your care partner should take you home directly after the procedure.  You should plan to take it easy, moving slowly for the rest of the day.  You can resume normal activity the day after the procedure however you should NOT DRIVE or use heavy machinery for 24 hours (because of the sedation medicines used during the test).    SYMPTOMS TO REPORT IMMEDIATELY: A gastroenterologist can be reached at any hour.  During normal business hours, 8:30 AM to 5:00 PM Monday through Friday,  call [phone].  After hours and on weekends, please call the GI answering service at [phone] who will take a message and have the physician on call contact you.   Following lower endoscopy (colonoscopy or flexible sigmoidoscopy):  Excessive amounts of blood in the stool  Significant tenderness or worsening of abdominal pains  Swelling of the abdomen that is new, acute  Fever of 100F or higher    FOLLOW UP: If any biopsies were taken you will be contacted by phone or by letter within the next 1-3 weeks.  Call your gastroenterologist if you have not heard about the biopsies in 3 weeks.  Our staff will call the home number listed on your records the next business day following your procedure to check on you and address any questions or concerns that you may have at that time regarding the information given to you following your procedure. This is a courtesy call and so if there is no answer at the home number and we have not heard from you through the emergency physician on call, we will assume that you have returned to your regular daily activities without incident.  SIGNATURES/CONFIDENTIALITY: You and/or your care partner have signed paperwork which will be entered into your electronic medical record.  These signatures attest to the fact that that the information above on your After Visit Summary has been reviewed and is understood.  Full responsibility of the confidentiality   of this discharge information lies with you and/or your care-partner.  Polyp, diverticulosis, hemorrhoid, and high fiber diet information given.  Liberal fluid intake encouraged.  Recall colonoscopy in 5 years-2020.

## 2013-09-19 NOTE — Progress Notes (Signed)
Called to room to assist during endoscopic procedure.  Patient ID and intended procedure confirmed with present staff. Received instructions for my participation in the procedure from the performing physician.  

## 2013-09-20 ENCOUNTER — Telehealth: Payer: Self-pay | Admitting: *Deleted

## 2013-09-20 NOTE — Telephone Encounter (Signed)
  Follow up Call-  Call back number 09/19/2013  Post procedure Call Back phone  # [phone]  Permission to leave phone message Yes     Patient questions:  Do you have a fever, pain , or abdominal swelling? no Pain Score  0 *  Have you tolerated food without any problems? yes  Have you been able to return to your normal activities? yes  Do you have any questions about your discharge instructions: Diet   no Medications  no Follow up visit  no  Do you have questions or concerns about your Care? no  Actions: * If pain score is 4 or above: No action needed, pain <4.

## 2013-09-30 ENCOUNTER — Encounter: Payer: Self-pay | Admitting: Gastroenterology

## 2014-09-19 ENCOUNTER — Encounter: Payer: Self-pay | Admitting: Gastroenterology

## 2014-12-17 DIAGNOSIS — I10 Essential (primary) hypertension: Secondary | ICD-10-CM | POA: Diagnosis not present

## 2014-12-17 DIAGNOSIS — L439 Lichen planus, unspecified: Secondary | ICD-10-CM | POA: Diagnosis not present

## 2014-12-17 DIAGNOSIS — E669 Obesity, unspecified: Secondary | ICD-10-CM | POA: Diagnosis not present

## 2014-12-17 DIAGNOSIS — E119 Type 2 diabetes mellitus without complications: Secondary | ICD-10-CM | POA: Diagnosis not present

## 2014-12-17 DIAGNOSIS — Z6833 Body mass index (BMI) 33.0-33.9, adult: Secondary | ICD-10-CM | POA: Diagnosis not present

## 2014-12-17 DIAGNOSIS — M859 Disorder of bone density and structure, unspecified: Secondary | ICD-10-CM | POA: Diagnosis not present

## 2015-02-04 DIAGNOSIS — S39012A Strain of muscle, fascia and tendon of lower back, initial encounter: Secondary | ICD-10-CM | POA: Diagnosis not present

## 2015-02-04 DIAGNOSIS — S29012A Strain of muscle and tendon of back wall of thorax, initial encounter: Secondary | ICD-10-CM | POA: Diagnosis not present

## 2015-02-04 DIAGNOSIS — M9902 Segmental and somatic dysfunction of thoracic region: Secondary | ICD-10-CM | POA: Diagnosis not present

## 2015-02-04 DIAGNOSIS — M9901 Segmental and somatic dysfunction of cervical region: Secondary | ICD-10-CM | POA: Diagnosis not present

## 2015-02-04 DIAGNOSIS — M47812 Spondylosis without myelopathy or radiculopathy, cervical region: Secondary | ICD-10-CM | POA: Diagnosis not present

## 2015-02-04 DIAGNOSIS — M9903 Segmental and somatic dysfunction of lumbar region: Secondary | ICD-10-CM | POA: Diagnosis not present

## 2015-03-07 DIAGNOSIS — M47812 Spondylosis without myelopathy or radiculopathy, cervical region: Secondary | ICD-10-CM | POA: Diagnosis not present

## 2015-03-07 DIAGNOSIS — M9903 Segmental and somatic dysfunction of lumbar region: Secondary | ICD-10-CM | POA: Diagnosis not present

## 2015-03-07 DIAGNOSIS — M9902 Segmental and somatic dysfunction of thoracic region: Secondary | ICD-10-CM | POA: Diagnosis not present

## 2015-03-07 DIAGNOSIS — S39012A Strain of muscle, fascia and tendon of lower back, initial encounter: Secondary | ICD-10-CM | POA: Diagnosis not present

## 2015-03-07 DIAGNOSIS — S29012A Strain of muscle and tendon of back wall of thorax, initial encounter: Secondary | ICD-10-CM | POA: Diagnosis not present

## 2015-03-07 DIAGNOSIS — M9901 Segmental and somatic dysfunction of cervical region: Secondary | ICD-10-CM | POA: Diagnosis not present

## 2015-06-18 ENCOUNTER — Other Ambulatory Visit: Payer: Self-pay | Admitting: Obstetrics and Gynecology

## 2015-06-18 DIAGNOSIS — R928 Other abnormal and inconclusive findings on diagnostic imaging of breast: Secondary | ICD-10-CM

## 2015-06-25 ENCOUNTER — Ambulatory Visit
Admission: RE | Admit: 2015-06-25 | Discharge: 2015-06-25 | Disposition: A | Discharge status: Home / Self Care | Payer: Medicare Other | Source: Ambulatory Visit | Attending: Obstetrics and Gynecology | Admitting: Obstetrics and Gynecology

## 2015-06-25 DIAGNOSIS — R928 Other abnormal and inconclusive findings on diagnostic imaging of breast: Secondary | ICD-10-CM

## 2018-09-01 ENCOUNTER — Ambulatory Visit (AMBULATORY_SURGERY_CENTER): Payer: Self-pay

## 2018-09-01 ENCOUNTER — Other Ambulatory Visit: Payer: Self-pay

## 2018-09-01 DIAGNOSIS — Z86010 Personal history of colonic polyps: Secondary | ICD-10-CM

## 2018-09-01 NOTE — Progress Notes (Signed)
Per pt, no allergies to soy or egg products.Pt not taking any weight loss meds or using  O2 at home.  Pt denies any sedation problems. Pt refused emmi video.  THe PV was done over the phone due to [place] -19. I  Verified pt's insurance and address. I reviewed prep instructions and medical history with the pt and will mail paperwork to the pt. The pt was advised to call with questions or changes prior to her procedure. She understood.

## 2018-09-02 ENCOUNTER — Telehealth: Payer: Self-pay | Admitting: Gastroenterology

## 2018-09-02 DIAGNOSIS — Z86010 Personal history of colonic polyps: Secondary | ICD-10-CM

## 2018-09-02 MED ORDER — NA SULFATE-K SULFATE-MG SULF 17.5-3.13-1.6 GM/177ML PO SOLN: ORAL | 0 refills | Status: DC

## 2018-09-02 NOTE — Telephone Encounter (Signed)
Patient called and advised that when she went to pick up the prep for the schedule colonoscopy it was not at her pharmacy. She would like a call once the prep has been called in.

## 2018-09-02 NOTE — Telephone Encounter (Signed)
suprep sent to [facility] per pt's request. Pt notified. Encouraged patient to call us back with any questions.

## 2018-09-05 ENCOUNTER — Telehealth: Payer: Self-pay | Admitting: *Deleted

## 2018-09-05 NOTE — Telephone Encounter (Signed)

## 2018-09-06 ENCOUNTER — Encounter: Payer: Self-pay | Admitting: Gastroenterology

## 2018-09-06 ENCOUNTER — Ambulatory Visit (AMBULATORY_SURGERY_CENTER): Payer: Medicare Other | Admitting: Gastroenterology

## 2018-09-06 ENCOUNTER — Other Ambulatory Visit: Payer: Self-pay

## 2018-09-06 VITALS — BP 163/83 | HR 67 | Temp 98.5°F | Resp 12 | Ht 63.0 in | Wt 194.0 lb

## 2018-09-06 DIAGNOSIS — Z800 Family history of malignant neoplasm of digestive organs: Secondary | ICD-10-CM

## 2018-09-06 DIAGNOSIS — Z86010 Personal history of colonic polyps: Secondary | ICD-10-CM | POA: Diagnosis not present

## 2018-09-06 DIAGNOSIS — D124 Benign neoplasm of descending colon: Secondary | ICD-10-CM

## 2018-09-06 DIAGNOSIS — K635 Polyp of colon: Secondary | ICD-10-CM | POA: Diagnosis not present

## 2018-09-06 DIAGNOSIS — D123 Benign neoplasm of transverse colon: Secondary | ICD-10-CM

## 2018-09-06 MED ORDER — SODIUM CHLORIDE 0.9 % IV SOLN: 500.0000 mL | Freq: Once | INTRAVENOUS | Status: DC

## 2018-09-06 NOTE — Progress Notes (Signed)
TEMP/ CW Vital signs/ JB

## 2018-09-06 NOTE — Progress Notes (Signed)
Called to room to assist during endoscopic procedure.  Patient ID and intended procedure confirmed with present staff. Received instructions for my participation in the procedure from the performing physician.  

## 2018-09-06 NOTE — Progress Notes (Signed)
Report to PACU, RN, vss, BBS= Clear.  

## 2018-09-06 NOTE — Patient Instructions (Signed)
Read all handouts given to you by  Your recovery room nurse.  YOU HAD AN ENDOSCOPIC PROCEDURE TODAY AT [facility]:   Refer to the procedure report that was given to you for any specific questions about what was found during the examination.  If the procedure report does not answer your questions, please call your gastroenterologist to clarify.  If you requested that your care partner not be given the details of your procedure findings, then the procedure report has been included in a sealed envelope for you to review at your convenience later.  YOU SHOULD EXPECT: Some feelings of bloating in the abdomen. Passage of more gas than usual.  Walking can help get rid of the air that was put into your GI tract during the procedure and reduce the bloating. If you had a lower endoscopy (such as a colonoscopy or flexible sigmoidoscopy) you may notice spotting of blood in your stool or on the toilet paper. If you underwent a bowel prep for your procedure, you may not have a normal bowel movement for a few days.  Please Note:  You might notice some irritation and congestion in your nose or some drainage.  This is from the oxygen used during your procedure.  There is no need for concern and it should clear up in a day or so.  SYMPTOMS TO REPORT IMMEDIATELY:   Following lower endoscopy (colonoscopy or flexible sigmoidoscopy):  Excessive amounts of blood in the stool  Significant tenderness or worsening of abdominal pains  Swelling of the abdomen that is new, acute  Fever of 100F or higher   For urgent or emergent issues, a gastroenterologist can be reached at any hour by calling [phone].   DIET:  We do recommend a small meal at first, but then you may proceed to your regular diet.  Drink plenty of fluids but you should avoid alcoholic beverages for 24 hours.  ACTIVITY:  You should plan to take it easy for the rest of today and you should NOT DRIVE or use heavy machinery until  tomorrow (because of the sedation medicines used during the test).    FOLLOW UP: Our staff will call the number listed on your records 48-72 hours following your procedure to check on you and address any questions or concerns that you may have regarding the information given to you following your procedure. If we do not reach you, we will leave a message.  We will attempt to reach you two times.  During this call, we will ask if you have developed any symptoms of COVID 19. If you develop any symptoms (ie: fever, flu-like symptoms, shortness of breath, cough etc.) before then, please call [phone].  If you test positive for Covid 19 in the 2 weeks post procedure, please call and report this information to [place].    If any biopsies were taken you will be contacted by phone or by letter within the next 1-3 weeks.  Please call us at [phone] if you have not heard about the biopsies in 3 weeks.    SIGNATURES/CONFIDENTIALITY: You and/or your care partner have signed paperwork which will be entered into your electronic medical record.  These signatures attest to the fact that that the information above on your After Visit Summary has been reviewed and is understood.  Full responsibility of the confidentiality of this discharge information lies with you and/or your care-partner.

## 2018-09-06 NOTE — Op Note (Signed)
[place] Patient Name: Laura Maxwell Procedure Date: 09/06/2018 3:11 PM MRN: [id number] Endoscopist: Ladene Artist , MD Age: 83 Referring MD:  Date of Birth: 13-Oct-1935 Gender: Female Account #: [id number] Procedure:                Colonoscopy Indications:              Surveillance: Personal history of adenomatous                            polyps on last colonoscopy 5 years ago Medicines:                Monitored Anesthesia Care Procedure:                Pre-Anesthesia Assessment:                           - Prior to the procedure, a History and Physical                            was performed, and patient medications and                            allergies were reviewed. The patient's tolerance of                            previous anesthesia was also reviewed. The risks                            and benefits of the procedure and the sedation                            options and risks were discussed with the patient.                            All questions were answered, and informed consent                            was obtained. Prior Anticoagulants: The patient has                            taken no previous anticoagulant or antiplatelet                            agents. ASA Grade Assessment: II - A patient with                            mild systemic disease. After reviewing the risks                            and benefits, the patient was deemed in                            satisfactory condition to undergo the procedure.  After obtaining informed consent, the colonoscope                            was passed under direct vision. Throughout the                            procedure, the patient's blood pressure, pulse, and                            oxygen saturations were monitored continuously. The                            Model PCF-[id number] scope was introduced                            through the anus and  advanced to the the cecum,                            identified by appendiceal orifice and ileocecal                            valve. The ileocecal valve, appendiceal orifice,                            and rectum were photographed. The quality of the                            bowel preparation was excellent. The patient                            tolerated the procedure well. The colonoscopy was                            somewhat difficult due to restricted mobility of                            the sigmoid colon and a tortuous sigmoid colon.                            Successful completion of the procedure was aided by                            changing the patient's position, withdrawing the                            scope and replacing with the pediatric colonoscope,                            straightening and shortening the scope to obtain                            bowel loop reduction and using scope torsion. Scope In: 3:26:11 PM Scope Out: 3:45:07 PM Scope Withdrawal Time: 0 hours 10 minutes 45  seconds  Total Procedure Duration: 0 hours 18 minutes 56 seconds  Findings:                 The perianal and digital rectal examinations were                            normal.                           Two sessile polyps were found in the descending                            colon and transverse colon. The polyps were 6 to 7                            mm in size. These polyps were removed with a cold                            snare. Resection and retrieval were complete.                           Multiple small-mouthed diverticula were found in                            the left colon. There was narrowing of the colon in                            association with the diverticular opening. There                            was evidence of diverticular spasm. There was no                            evidence of diverticular bleeding.                           The exam was otherwise  without abnormality on                            direct and retroflexion views. Complications:            No immediate complications. Estimated blood loss:                            None. Estimated Blood Loss:     Estimated blood loss: none. Impression:               - Two 6 to 7 mm polyps in the descending colon and                            in the transverse colon, removed with a cold snare.                            Resected and retrieved.                           -  Severe diverticulosis in the left colon.                           - The examination was otherwise normal on direct                            and retroflexion views. Recommendation:           - Patient has a contact number available for                            emergencies. The signs and symptoms of potential                            delayed complications were discussed with the                            patient. Return to normal activities tomorrow.                            Written discharge instructions were provided to the                            patient.                           - High fiber diet long term.                           - Continue present medications.                           - Await pathology results.                           - No repeat colonoscopy due to age. Ladene Artist, MD 09/06/2018 3:50:31 PM This report has been signed electronically.

## 2018-09-08 ENCOUNTER — Telehealth: Payer: Self-pay | Admitting: *Deleted

## 2018-09-08 NOTE — Telephone Encounter (Signed)
  Follow up Call-  Call back number 09/06/2018  Post procedure Call Back phone  # [phone]  Permission to leave phone message Yes  Some recent data might be hidden     Patient questions:  Do you have a fever, pain , or abdominal swelling? No. Pain Score  0 *  Have you tolerated food without any problems? Yes.    Have you been able to return to your normal activities? Yes.    Do you have any questions about your discharge instructions: Diet   No. Medications  No. Follow up visit  No.  Do you have questions or concerns about your Care? No.  Actions: * If pain score is 4 or above: No action needed, pain <4.   1. Have you developed a fever since your procedure? NO  2.   Have you had an respiratory symptoms (SOB or cough) since your procedure? NO  3.   Have you tested positive for COVID 19 since your procedure NO  4.   Have you had any family members/close contacts diagnosed with the COVID 19 since your procedure?  NO   If yes to any of these questions please route to Joylene John, RN and Alphonsa Gin, RN.

## 2018-09-12 ENCOUNTER — Encounter: Payer: Self-pay | Admitting: Gastroenterology

## 2018-09-16 ENCOUNTER — Emergency Department (HOSPITAL_COMMUNITY): Payer: Medicare Other

## 2018-09-16 ENCOUNTER — Encounter (HOSPITAL_COMMUNITY): Payer: Self-pay | Admitting: Emergency Medicine

## 2018-09-16 ENCOUNTER — Emergency Department (HOSPITAL_COMMUNITY)
Admission: EM | Admit: 2018-09-16 | Discharge: 2018-09-16 | Disposition: A | Discharge status: Home / Self Care | Payer: Medicare Other | Attending: Emergency Medicine | Admitting: Emergency Medicine

## 2018-09-16 ENCOUNTER — Other Ambulatory Visit: Payer: Self-pay

## 2018-09-16 DIAGNOSIS — M069 Rheumatoid arthritis, unspecified: Secondary | ICD-10-CM | POA: Insufficient documentation

## 2018-09-16 DIAGNOSIS — E119 Type 2 diabetes mellitus without complications: Secondary | ICD-10-CM | POA: Insufficient documentation

## 2018-09-16 DIAGNOSIS — R0789 Other chest pain: Secondary | ICD-10-CM | POA: Insufficient documentation

## 2018-09-16 DIAGNOSIS — Z7984 Long term (current) use of oral hypoglycemic drugs: Secondary | ICD-10-CM | POA: Diagnosis not present

## 2018-09-16 DIAGNOSIS — R079 Chest pain, unspecified: Secondary | ICD-10-CM | POA: Diagnosis present

## 2018-09-16 DIAGNOSIS — I10 Essential (primary) hypertension: Secondary | ICD-10-CM | POA: Insufficient documentation

## 2018-09-16 DIAGNOSIS — Z79899 Other long term (current) drug therapy: Secondary | ICD-10-CM | POA: Insufficient documentation

## 2018-09-16 LAB — BASIC METABOLIC PANEL
Anion gap: 14 (ref 5–15)
BUN: 12 mg/dL (ref 8–23)
CO2: 22 mmol/L (ref 22–32)
Calcium: 10 mg/dL (ref 8.9–10.3)
Chloride: 102 mmol/L (ref 98–111)
Creatinine, Ser: 0.78 mg/dL (ref 0.44–1.00)
GFR calc Af Amer: >60 mL/min (ref >60)
GFR calc non Af Amer: >60 mL/min (ref >60)
Glucose, Bld: 230 mg/dL — ABNORMAL HIGH (ref 70–99)
Potassium: 3.9 mmol/L (ref 3.5–5.1)
Sodium: 138 mmol/L (ref 135–145)

## 2018-09-16 LAB — TROPONIN I
Troponin I: <0.03 ng/mL (ref <0.03)
Troponin I: <0.03 ng/mL (ref <0.03)

## 2018-09-16 LAB — CBC
HCT: 42.2 % (ref 36.0–46.0)
Hemoglobin: 13.6 g/dL (ref 12.0–15.0)
MCH: 30.7 pg (ref 26.0–34.0)
MCHC: 32.2 g/dL (ref 30.0–36.0)
MCV: 95.3 fL (ref 80.0–100.0)
Platelets: 328 10*3/uL (ref 150–400)
RBC: 4.43 MIL/uL (ref 3.87–5.11)
RDW: 13.1 % (ref 11.5–15.5)
WBC: 12.3 10*3/uL — ABNORMAL HIGH (ref 4.0–10.5)
nRBC: 0 % (ref 0.0–0.2)

## 2018-09-16 MED ORDER — SODIUM CHLORIDE 0.9% FLUSH: 3.0000 mL | Freq: Once | INTRAVENOUS | Status: DC

## 2018-09-16 NOTE — ED Notes (Signed)
Patient verbalizes understanding of discharge instructions. Opportunity for questioning and answers were provided. Armband removed by staff, pt discharged from ED in wheelchair.  

## 2018-09-16 NOTE — ED Provider Notes (Signed)
[facility] EMERGENCY DEPARTMENT Provider Note   CSN: [id number] Arrival date & time: 09/16/18  2009    History   Chief Complaint Chief Complaint  Patient presents with  . Chest Pain    HPI Laura Maxwell is a 83 y.o. female.     Pt presents to the ED today with cp.  Pt said she ate a half a chicken salad sandwich and a half an egg salad sandwich, then laid down.  She developed cp with pain radiating up to her teeth.  She took several rolaids which helped sx.  Sx are gone now.  The pt thought it was indigestion, but wanted to get her heart checked because the pain did radiate to her jaw.  Pt denies associated sob or n/v.  She denies any f/c or known covid exposures.     Past Medical History:  Diagnosis Date  . Allergic    Pt is under care of allergy doctor due to swelling of tongue-unsure of med  . Cataract    bil/had surgery  . Diabetes mellitus without complication ([place])   . Diverticulosis   . Hyperlipidemia   . Hypertension   . Rheumatoid arthritis [facility])     Patient Active Problem List   Diagnosis Date Noted  . Angioedema 01/20/2013  . HTN (hypertension) 01/20/2013    Past Surgical History:  Procedure Laterality Date  . ABDOMINAL SURGERY     benign tumor  . BUNIONECTOMY     right foot  . DILATION AND CURETTAGE OF UTERUS    . DILATION AND CURETTAGE OF UTERUS    . ROTATOR CUFF REPAIR     right     OB History   No obstetric history on file.      Home Medications    Prior to Admission medications   Medication Sig Start Date End Date Taking? Authorizing Provider  acetaminophen (TYLENOL) 650 MG CR tablet Take 1,300 mg by mouth 2 (two) times daily as needed for pain.   Yes [provider]  amLODipine (NORVASC) 5 MG tablet Take 5 mg by mouth at bedtime.    Yes [provider]  cetirizine (ZYRTEC) 10 MG tablet Take 10 mg by mouth daily as needed for allergies or rhinitis.    Yes [provider]  clobetasol  cream (TEMOVATE) 9.03 % Place 1 application vaginally daily as needed for itching. 04/13/18  Yes [provider]  Coenzyme Q10 (CO Q 10 PO) Take 1 capsule by mouth 2 (two) times a week.    Yes [provider]  Glucosamine-Chondroitin (GLUCOSAMINE CHONDR COMPLEX PO) Take 1 tablet by mouth daily with breakfast.   Yes [provider]  JARDIANCE 10 MG TABS tablet Take 10 mg by mouth daily before breakfast. 07/23/18  Yes [provider]  labetalol (NORMODYNE) 300 MG tablet Take 300 mg by mouth 2 (two) times daily.    Yes [provider]  metFORMIN (GLUCOPHAGE) 500 MG tablet Take 500 mg by mouth daily with breakfast. 06/18/18  Yes [provider]  Multiple Vitamins-Minerals (ONE-A-DAY WOMENS 50 PLUS) TABS Take 1 tablet by mouth daily with breakfast.   Yes [provider]  predniSONE (DELTASONE) 5 MG tablet Take 5 mg by mouth daily. 08/30/18  Yes [provider]  spironolactone (ALDACTONE) 25 MG tablet Take 25 mg by mouth daily.   Yes [provider]  VITAMIN E PO Take 1 capsule by mouth 2 (two) times a week.   Yes [provider]  EPINEPHrine (EPI-PEN) 0.3 mg/0.3 mL SOAJ injection Inject 0.3 mLs (0.3 mg total) into the muscle once. Patient not taking: Reported on 09/16/2018 01/20/13   Shon Baton, MD  rosuvastatin (CRESTOR) 5 MG tablet Take 5 mg by mouth every other day.     [provider]    Family History Family History  Problem Relation Age of Onset  . Breast cancer Mother   . Bone cancer Mother   . Colon cancer Sister   . Diabetes Sister   . Heart disease Brother   . Diabetes Brother     Social History Social History   Tobacco Use  . Smoking status: Never Smoker  . Smokeless tobacco: Never Used  Substance Use Topics  . Alcohol use: No  . Drug use: No     Allergies   Ace inhibitors and Phenazopyridine   Review of Systems Review of Systems  Cardiovascular: Positive for chest pain.   All other systems reviewed and are negative.    Physical Exam Updated Vital Signs BP (!) 126/59   Pulse 73   Temp 98.6 F (37 C) (Oral)   Resp 16   Ht 5\' 3"  (1.6 m)   Wt 80.7 kg   SpO2 96%   BMI 31.53 kg/m   Physical Exam Vitals signs and nursing note reviewed.  Constitutional:      Appearance: She is well-developed.  HENT:     Head: Normocephalic and atraumatic.  Eyes:     Extraocular Movements: Extraocular movements intact.     Pupils: Pupils are equal, round, and reactive to light.  Neck:     Musculoskeletal: Normal range of motion and neck supple.  Cardiovascular:     Rate and Rhythm: Normal rate and regular rhythm.     Heart sounds: Normal heart sounds.  Pulmonary:     Effort: Pulmonary effort is normal.     Breath sounds: Normal breath sounds.  Abdominal:     General: Bowel sounds are normal.     Palpations: Abdomen is soft.  Musculoskeletal: Normal range of motion.  Skin:    General: Skin is warm and dry.     Capillary Refill: Capillary refill takes less than 2 seconds.  Neurological:     General: No focal deficit present.     Mental Status: She is alert and oriented to person, place, and time.  Psychiatric:        Mood and Affect: Mood normal.        Behavior: Behavior normal.      ED Treatments / Results  Labs (all labs ordered are listed, but only abnormal results are displayed) Labs Reviewed  BASIC METABOLIC PANEL - Abnormal; Notable for the following components:      Result Value   Glucose, Bld 230 (*)    All other components within normal limits  CBC - Abnormal; Notable for the following components:   WBC 12.3 (*)    All other components within normal limits  TROPONIN I  TROPONIN I    EKG EKG Interpretation  Date/Time:  Friday September 16 2018 20:15:11 EDT Ventricular Rate:  91 PR Interval:  172 QRS Duration: 78 QT Interval:  358 QTC Calculation: 440 R Axis:   -18 Text Interpretation:  Normal sinus rhythm Possible Left atrial  enlargement Borderline ECG No significant change since last tracing Confirmed by Isla Pence [phone]) on 09/16/2018 8:57:09 PM   Radiology Dg Chest 2 View  Result Date: 09/16/2018 CLINICAL DATA:  Central chest pain. EXAM:  CHEST - 2 VIEW COMPARISON:  06/04/2004 FINDINGS: Cardiomediastinal silhouette is normal. Mediastinal contours appear intact. Calcific atherosclerotic disease of the aorta. There is no evidence of focal airspace consolidation, pleural effusion or pneumothorax. Osseous structures are without acute abnormality. Soft tissues are grossly normal. IMPRESSION: No active cardiopulmonary disease. Electronically Signed   By: Fidela Salisbury M.D.   On: 09/16/2018 20:33    Procedures Procedures (including critical care time)  Medications Ordered in ED Medications  sodium chloride flush (NS) 0.9 % injection 3 mL (3 mLs Intravenous Not Given 09/16/18 2058)     Initial Impression / Assessment and Plan / ED Course  I have reviewed the triage vital signs and the nursing notes.  Pertinent labs & imaging results that were available during my care of the patient were reviewed by me and considered in my medical decision making (see chart for details).       Pt's sx are gone and atypical.  Pt is stable for d/c.  Return if worse.  Final Clinical Impressions(s) / ED Diagnoses   Final diagnoses:  Atypical chest pain    ED Discharge Orders    None       Isla Pence, MD 09/16/18 [id number]

## 2018-09-16 NOTE — ED Triage Notes (Signed)
Reports central chest aching after eating dinner tonight.  Thought it was indigestion but got no relief from rolaids.  Also endorses teeth are hurting top and bottome which is new this evening.

## 2019-03-14 ENCOUNTER — Ambulatory Visit: Payer: Medicare Other | Admitting: Neurology

## 2019-03-14 ENCOUNTER — Encounter: Payer: Self-pay | Admitting: Neurology

## 2019-03-14 ENCOUNTER — Other Ambulatory Visit: Payer: Self-pay

## 2019-03-14 VITALS — BP 152/93 | HR 78 | Temp 97.5°F | Ht 65.5 in | Wt 174.0 lb

## 2019-03-14 DIAGNOSIS — H81399 Other peripheral vertigo, unspecified ear: Secondary | ICD-10-CM | POA: Diagnosis not present

## 2019-03-14 NOTE — Patient Instructions (Addendum)
MRI of the brain - we will call to schedule Return to the office if the symptoms happen again  Benign Positional Vertigo Vertigo is the feeling that you or your surroundings are moving when they are not. Benign positional vertigo is the most common form of vertigo. This is usually a harmless condition (benign). This condition is positional. This means that symptoms are triggered by certain movements and positions. This condition can be dangerous if it occurs while you are doing something that could cause harm to you or others. This includes activities such as driving or operating machinery. What are the causes? In many cases, the cause of this condition is not known. It may be caused by a disturbance in an area of the inner ear that helps your brain to sense movement and balance. This disturbance can be caused by:  Viral infection (labyrinthitis).  Head injury.  Repetitive motion, such as jumping, dancing, or running. What increases the risk? You are more likely to develop this condition if:  You are a woman.  You are 81 years of age or older. What are the signs or symptoms? Symptoms of this condition usually happen when you move your head or your eyes in different directions. Symptoms may start suddenly, and usually last for less than a minute. They include:  Loss of balance and falling.  Feeling like you are spinning or moving.  Feeling like your surroundings are spinning or moving.  Nausea and vomiting.  Blurred vision.  Dizziness.  Involuntary eye movement (nystagmus). Symptoms can be mild and cause only minor problems, or they can be severe and interfere with daily life. Episodes of benign positional vertigo may return (recur) over time. Symptoms may improve over time. How is this diagnosed? This condition may be diagnosed based on:  Your medical history.  Physical exam of the head, neck, and ears.  Tests, such as: ? MRI. ? CT scan. ? Eye movement tests. Your  health care provider may ask you to change positions quickly while he or she watches you for symptoms of benign positional vertigo, such as nystagmus. Eye movement may be tested with a variety of exams that are designed to evaluate or stimulate vertigo. ? An electroencephalogram (EEG). This records electrical activity in your brain. ? Hearing tests. You may be referred to a health care provider who specializes in ear, nose, and throat (ENT) problems (otolaryngologist) or a provider who specializes in disorders of the nervous system (neurologist). How is this treated?  This condition may be treated in a session in which your health care provider moves your head in specific positions to adjust your inner ear back to normal. Treatment for this condition may take several sessions. Surgery may be needed in severe cases, but this is rare. In some cases, benign positional vertigo may resolve on its own in 2-4 weeks. Follow these instructions at home: Safety  Move slowly. Avoid sudden body or head movements or certain positions, as told by your health care provider.  Avoid driving until your health care provider says it is safe for you to do so.  Avoid operating heavy machinery until your health care provider says it is safe for you to do so.  Avoid doing any tasks that would be dangerous to you or others if vertigo occurs.  If you have trouble walking or keeping your balance, try using a cane for stability. If you feel dizzy or unstable, sit down right away.  Return to your normal activities as told by  your health care provider. Ask your health care provider what activities are safe for you. General instructions  Take over-the-counter and prescription medicines only as told by your health care provider.  Drink enough fluid to keep your urine pale yellow.  Keep all follow-up visits as told by your health care provider. This is important. Contact a health care provider if:  You have a fever.   Your condition gets worse or you develop new symptoms.  Your family or friends notice any behavioral changes.  You have nausea or vomiting that gets worse.  You have numbness or a "pins and needles" sensation. Get help right away if you:  Have difficulty speaking or moving.  Are always dizzy.  Faint.  Develop severe headaches.  Have weakness in your legs or arms.  Have changes in your hearing or vision.  Develop a stiff neck.  Develop sensitivity to light. Summary  Vertigo is the feeling that you or your surroundings are moving when they are not. Benign positional vertigo is the most common form of vertigo.  The cause of this condition is not known. It may be caused by a disturbance in an area of the inner ear that helps your brain to sense movement and balance.  Symptoms include loss of balance and falling, feeling that you or your surroundings are moving, nausea and vomiting, and blurred vision.  This condition can be diagnosed based on symptoms, physical exam, and other tests, such as MRI, CT scan, eye movement tests, and hearing tests.  Follow safety instructions as told by your health care provider. You will also be told when to contact your health care provider in case of problems. This information is not intended to replace advice given to you by your health care provider. Make sure you discuss any questions you have with your health care provider. Document Released: 12/22/2005 Document Revised: 08/25/2017 Document Reviewed: 08/25/2017 Elsevier Patient Education  2020 [employer].

## 2019-03-14 NOTE — Progress Notes (Signed)
[facility]    Provider:  Dr Jaynee Eagles Requesting Provider: Melida Quitter, MD Primary Care Provider:  Shon Baton, MD  CC:  dizziness  HPI:  Laura Maxwell is a 83 y.o. female here as requested by Melida Quitter, MD for dizziness. The dizziness was feeling like someone pulling her back. She has been on a lot of medications recently for RA. For about 2-3 months felt dizzy when moving head, dizzy, pulling her backwards it was very odd. It would only happen when she was going to bed or laying down on the couch. She saw ENT, she had extensive testing which was negative but she was not symptomatic during the testing. It came on acutely and then one day stopped she hasn't had it in a few months.Symptoms have resolved. No other focal symptoms, no head trauma or prior ilnesses. No current symptoms. No hearing changes.   Reviewed notes, labs and imaging from outside physicians, which showed:  BMP with elevated glucose, cbc with elevated WBCs 08/2018  Review of Systems: Patient complains of symptoms per HPI as well as the following symptoms: dizziness. Pertinent negatives and positives per HPI. All others negative.   Social History   Socioeconomic History  . Marital status: Married    Spouse name: Not on file  . Number of children: 2  . Years of education: Not on file  . Highest education level: High school graduate  Occupational History  . Occupation: retired  Tobacco Use  . Smoking status: Never Smoker  . Smokeless tobacco: Never Used  Substance and Sexual Activity  . Alcohol use: Never  . Drug use: Never  . Sexual activity: Not on file  Other Topics Concern  . Not on file  Social History Narrative   Lives at home with husband   Right handed   Caffeine: no longer    Social Determinants of Health   Financial Resource Strain:   . Difficulty of Paying Living Expenses: Not on file  Food Insecurity:   . Worried About Charity fundraiser in the Last Year: Not on file    . Ran Out of Food in the Last Year: Not on file  Transportation Needs:   . Lack of Transportation (Medical): Not on file  . Lack of Transportation (Non-Medical): Not on file  Physical Activity:   . Days of Exercise per Week: Not on file  . Minutes of Exercise per Session: Not on file  Stress:   . Feeling of Stress : Not on file  Social Connections:   . Frequency of Communication with Friends and Family: Not on file  . Frequency of Social Gatherings with Friends and Family: Not on file  . Attends Religious Services: Not on file  . Active Member of Clubs or Organizations: Not on file  . Attends Archivist Meetings: Not on file  . Marital Status: Not on file  Intimate Partner Violence:   . Fear of Current or Ex-Partner: Not on file  . Emotionally Abused: Not on file  . Physically Abused: Not on file  . Sexually Abused: Not on file    Family History  Problem Relation Age of Onset  . Breast cancer Mother   . Bone cancer Mother   . Heart Problems Mother   . Emphysema Father   . Prostate cancer Father   . Colon cancer Sister   . Diabetes Sister   . Heart disease Brother   . Diabetes Brother     Past Medical History:  Diagnosis Date  . Allergic    Pt is under care of allergy doctor due to swelling of tongue-unsure of med  . Cataract    bil/had surgery  . Diabetes mellitus without complication ([place])   . Diverticulosis   . Hyperlipidemia   . Hypertension   . Rheumatoid arthritis [facility])     Patient Active Problem List   Diagnosis Date Noted  . Angioedema 01/20/2013  . HTN (hypertension) 01/20/2013    Past Surgical History:  Procedure Laterality Date  . ABDOMINAL SURGERY     benign tumor  . BUNIONECTOMY     right foot  . DILATION AND CURETTAGE OF UTERUS    . DILATION AND CURETTAGE OF UTERUS    . ROTATOR CUFF REPAIR     right    Current Outpatient Medications  Medication Sig Dispense Refill  . amLODipine (NORVASC) 5 MG tablet Take 5 mg by mouth at  bedtime.     . clobetasol cream (TEMOVATE) [id number] % Place 1 application vaginally daily as needed for itching.    . folic acid (FOLVITE) 1 MG tablet Take 1 mg by mouth daily.    Marland Kitchen JARDIANCE 10 MG TABS tablet Take 10 mg by mouth daily before breakfast.    . labetalol (NORMODYNE) 300 MG tablet Take 300 mg by mouth 2 (two) times daily.     . meloxicam (MOBIC) 15 MG tablet Take 15 mg by mouth daily.    . metFORMIN (GLUCOPHAGE) 500 MG tablet Take 500 mg by mouth daily with breakfast.    . methotrexate (RHEUMATREX) 2.5 MG tablet Take 15 mg by mouth once a week.    . Multiple Vitamins-Minerals (ONE-A-DAY WOMENS 50 PLUS) TABS Take 1 tablet by mouth daily with breakfast.    . spironolactone (ALDACTONE) 25 MG tablet Take 25 mg by mouth daily.    Marland Kitchen EPINEPHrine (EPI-PEN) 0.3 mg/0.3 mL SOAJ injection Inject 0.3 mLs (0.3 mg total) into the muscle once. (Patient not taking: Reported on 09/16/2018) 1 Device 0  . predniSONE (DELTASONE) 5 MG tablet Take 5 mg by mouth daily.    Marland Kitchen VITAMIN E PO Take 1 capsule by mouth 2 (two) times a week.     Current Facility-Administered Medications  Medication Dose Route Frequency Provider Last Rate Last Admin  . 0.9 %  sodium chloride infusion  500 mL Intravenous Once Ladene Artist, MD        Allergies as of 03/14/2019 - Review Complete 03/14/2019  Allergen Reaction Noted  . Ace inhibitors Swelling and Other (See Comments) 10/05/2011  . Phenazopyridine Swelling and Other (See Comments) 10/05/2011    Vitals: BP (!) 152/93 (BP Location: Right Arm, Patient Position: Sitting)   Pulse 78   Temp (!) 97.5 F (36.4 C)   Ht 5' 5.5" (1.664 m)   Wt 174 lb (78.9 kg)   BMI 28.51 kg/m  Last Weight:  Wt Readings from Last 1 Encounters:  03/14/19 174 lb (78.9 kg)   Last Height:   Ht Readings from Last 1 Encounters:  03/14/19 5' 5.5" (1.664 m)     Physical exam: Exam: Gen: NAD, conversant, well nourised, well groomed                     CV: RRR, no MRG. No Carotid  Bruits. No peripheral edema, warm, nontender Eyes: Conjunctivae clear without exudates or hemorrhage  Neuro: Detailed Neurologic Exam  Speech:    Speech is normal; fluent and spontaneous with normal comprehension.  Cognition:  The patient is oriented to person, place, and time;     recent and remote memory intact;     language fluent;     Impaired attention, concentration, fund of knowledge Cranial Nerves:    The pupils are equal, round, and reactive to light. Attempted fundoscopy could not visualize due to small pupils. Visual fields are full to finger confrontation. Extraocular movements are intact. Trigeminal sensation is intact and the muscles of mastication are normal. The face is symmetric. The palate elevates in the midline. Hearing intact. Voice is normal. Shoulder shrug is normal. The tongue has normal motion without fasciculations.   Coordination:    Normal finger to nose and heel to shin.   Gait:    Heel-toe and tandem gait are normal with mild imbalance  Motor Observation:    No asymmetry, no atrophy, and no involuntary movements noted. Tone:    Normal muscle tone.    Posture:    Posture is normal. normal erect    Strength:    Strength is V/V in the upper and lower limbs.      Sensation: intact to LT     Reflex Exam:  DTR's:    Deep tendon reflexes in the upper and lower extremities are symmetrical bilaterally.   Toes:    The toes are equiv bilaterally.   Clonus:    Clonus is absent.    Assessment/Plan:  Lovely patient with 2-3 months of "dizziness" feeling like she was falling backwards when in bed and with movements. Really sounds like BPPV, unclear why her ENT eval did not show this (she said she was not symptomatic when she was tested by ENT so may be neg dix hallpike/VNG bc of resolution). However, since ENT eval was neg, we can check MRI brain ensure no strokes, since symptoms resolved can order MRI brain wo contrast, can refer to vestibular therapy if  symptoms occur again.    MRI of the brain   Orders Placed This Encounter  Procedures  . MR BRAIN WO CONTRAST   No orders of the defined types were placed in this encounter.   Cc: Shon Baton, MD,  Melida Quitter, MD  Sarina Ill, MD  [facility] [address] [place], [place] [address]  Phone [phone] Fax [phone]

## 2019-04-12 ENCOUNTER — Other Ambulatory Visit: Payer: No Typology Code available for payment source

## 2019-05-14 ENCOUNTER — Ambulatory Visit: Payer: Medicare PPO

## 2019-10-05 DIAGNOSIS — Z1231 Encounter for screening mammogram for malignant neoplasm of breast: Secondary | ICD-10-CM | POA: Diagnosis not present

## 2019-11-17 DIAGNOSIS — E559 Vitamin D deficiency, unspecified: Secondary | ICD-10-CM | POA: Diagnosis not present

## 2019-11-17 DIAGNOSIS — R413 Other amnesia: Secondary | ICD-10-CM | POA: Diagnosis not present

## 2019-11-17 DIAGNOSIS — I10 Essential (primary) hypertension: Secondary | ICD-10-CM | POA: Diagnosis not present

## 2019-11-17 DIAGNOSIS — E119 Type 2 diabetes mellitus without complications: Secondary | ICD-10-CM | POA: Diagnosis not present

## 2019-11-21 DIAGNOSIS — E785 Hyperlipidemia, unspecified: Secondary | ICD-10-CM | POA: Diagnosis not present

## 2019-11-21 DIAGNOSIS — M1990 Unspecified osteoarthritis, unspecified site: Secondary | ICD-10-CM | POA: Diagnosis not present

## 2019-11-21 DIAGNOSIS — K760 Fatty (change of) liver, not elsewhere classified: Secondary | ICD-10-CM | POA: Diagnosis not present

## 2019-11-21 DIAGNOSIS — M069 Rheumatoid arthritis, unspecified: Secondary | ICD-10-CM | POA: Diagnosis not present

## 2019-11-21 DIAGNOSIS — D8989 Other specified disorders involving the immune mechanism, not elsewhere classified: Secondary | ICD-10-CM | POA: Diagnosis not present

## 2019-11-21 DIAGNOSIS — R413 Other amnesia: Secondary | ICD-10-CM | POA: Diagnosis not present

## 2019-11-21 DIAGNOSIS — G4733 Obstructive sleep apnea (adult) (pediatric): Secondary | ICD-10-CM | POA: Diagnosis not present

## 2019-11-21 DIAGNOSIS — M859 Disorder of bone density and structure, unspecified: Secondary | ICD-10-CM | POA: Diagnosis not present

## 2019-11-21 DIAGNOSIS — E119 Type 2 diabetes mellitus without complications: Secondary | ICD-10-CM | POA: Diagnosis not present

## 2019-11-21 DIAGNOSIS — I10 Essential (primary) hypertension: Secondary | ICD-10-CM | POA: Diagnosis not present

## 2019-12-11 DIAGNOSIS — M0609 Rheumatoid arthritis without rheumatoid factor, multiple sites: Secondary | ICD-10-CM | POA: Diagnosis not present

## 2019-12-11 DIAGNOSIS — E663 Overweight: Secondary | ICD-10-CM | POA: Diagnosis not present

## 2019-12-11 DIAGNOSIS — M2550 Pain in unspecified joint: Secondary | ICD-10-CM | POA: Diagnosis not present

## 2019-12-11 DIAGNOSIS — R5382 Chronic fatigue, unspecified: Secondary | ICD-10-CM | POA: Diagnosis not present

## 2019-12-11 DIAGNOSIS — Z6829 Body mass index (BMI) 29.0-29.9, adult: Secondary | ICD-10-CM | POA: Diagnosis not present

## 2019-12-11 DIAGNOSIS — Z79899 Other long term (current) drug therapy: Secondary | ICD-10-CM | POA: Diagnosis not present

## 2020-01-23 ENCOUNTER — Institutional Professional Consult (permissible substitution): Payer: No Typology Code available for payment source | Admitting: Neurology

## 2020-01-29 ENCOUNTER — Ambulatory Visit: Payer: Medicare PPO | Admitting: Neurology

## 2020-01-29 ENCOUNTER — Encounter: Payer: Self-pay | Admitting: *Deleted

## 2020-01-29 ENCOUNTER — Other Ambulatory Visit: Payer: Self-pay

## 2020-01-29 ENCOUNTER — Encounter: Payer: Self-pay | Admitting: Neurology

## 2020-01-29 VITALS — BP 127/71 | HR 73 | Ht 65.5 in | Wt 177.0 lb

## 2020-01-29 DIAGNOSIS — G3184 Mild cognitive impairment, so stated: Secondary | ICD-10-CM | POA: Diagnosis not present

## 2020-01-29 NOTE — Progress Notes (Signed)
Per [facility] referral papers.

## 2020-01-29 NOTE — Patient Instructions (Signed)
Blood work MRI of the brain Send to formal Neurocognitive testing   Mild Neurocognitive Disorder Mild neurocognitive disorder, formerly known as mild cognitive impairment, is a disorder in which memory does not work as well as it should. This disorder may also cause problems with other mental functions, including thought, communication, behavior, and completion of tasks. These problems can be noticed and measured, but they usually do not interfere with daily activities or the ability to live independently. Mild neurocognitive disorder typically develops after 82 years of age, but it can also develop at younger ages. It is not as serious as major neurocognitive disorder, formerly known as dementia, but it may be the first sign of it. Generally, symptoms of this condition get worse over time. In rare cases, symptoms can get better. What are the causes? This condition may be caused by:  Brain disorders like Alzheimer's disease, Parkinson's disease, and other conditions that gradually damage nerve cells (neurodegenerative conditions).  Diseases that affect blood vessels in the brain and result in small strokes.  Certain infections, such as HIV.  Traumatic brain injury.  Other medical conditions, such as brain tumors, underactive thyroid (hypothyroidism), and vitamin B12 deficiency.  Use of certain drugs or prescription medicines. What increases the risk? The following factors may make you more likely to develop this condition:  Being older than 49.  Being female.  Low education level.  Diabetes, high blood pressure, high cholesterol, and other conditions that increase the risk for blood vessel diseases.  Untreated or undertreated sleep apnea.  Having a certain type of gene that can be passed from parent to child (inherited).  Chronic health problems such as heart disease, lung disease, liver disease, kidney disease, or depression. What are the signs or symptoms? Symptoms of this  condition include:  Difficulty remembering. You may: ? Forget names, phone numbers, or details of recent events. ? Forget social events and appointments. ? Repeatedly forget where you put your car keys or other items.  Difficulty thinking and solving problems. You may have trouble with complex tasks, such as: ? Paying bills. ? Driving in unfamiliar places.  Difficulty communicating. You may have trouble: ? Finding the right word or naming an object. ? Forming a sentence that makes sense, or understanding what you read or hear.  Changes in your behavior or personality. When this happens, you may: ? Lose interest in the things that you used to enjoy. ? Withdraw from social situations. ? Get angry more easily than usual. ? Act before thinking. How is this diagnosed? This condition is diagnosed based on:  Your symptoms. Your health care provider may ask you and the people you spend time with, such as family and friends, about your symptoms.  Evaluation of mental functions (neuropsychological testing). Your health care provider may refer you to a neurologist or mental health specialist to evaluate your mental functions in detail. To identify the cause of your condition, your health care provider may:  Get a detailed medical history.  Ask about use of alcohol, drugs, and prescription medicines.  Do a physical exam.  Order blood tests and brain imaging exams. How is this treated? Mild neurocognitive disorder that is caused by medicine use, drug use, infection, or another medical condition may improve when the cause is treated, or when medicines or drugs are stopped. If this disorder has another cause, it generally does not improve and may get worse. In these cases, the goal of treatment is to help you manage the loss of mental  function. Treatments in these cases include:  Medicine. Medicine mainly helps memory and behavior symptoms.  Talk therapy. Talk therapy provides education,  emotional support, memory aids, and other ways of making up for problems with mental function.  Lifestyle changes, including: ? Getting regular exercise. ? Eating a healthy diet that includes omega-3 fatty acids. ? Challenging your thinking and memory skills. ? Having more social interaction. Follow these instructions at home: Eating and drinking   Drink enough fluid to keep your urine pale yellow.  Eat a healthy diet that includes omega-3 fatty acids. These can be found in: ? Fish. ? Nuts. ? Leafy vegetables. ? Vegetable oils.  If you drink alcohol: ? Limit how much you use to:  0-1 drink a day for women.  0-2 drinks a day for men. ? Be aware of how much alcohol is in your drink. In the [place] one drink equals one 12 oz bottle of beer (355 mL), one 5 oz glass of wine (148 mL), or one 1 oz glass of hard liquor (44 mL). Lifestyle   Get regular exercise as told by your health care provider.  Do not use any products that contain nicotine or tobacco, such as cigarettes, e-cigarettes, and chewing tobacco. If you need help quitting, ask your health care provider.  Practice ways to manage stress. If you need help managing stress, ask your health care provider.  Continue to have social interaction.  Keep your mind active with stimulating activities you enjoy, such as reading or playing games.  Make sure to get quality sleep. Follow these tips: ? Avoid napping during the day. ? Keep your sleeping area dark and cool. ? Avoid exercising during the few hours before you go to bed. ? Avoid caffeine products in the evening. General instructions  Take over-the-counter and prescription medicines only as told by your health care provider. Your health care provider may recommend that you avoid taking medicines that can affect thinking, such as pain medicines or sleep medicines.  Work with your health care provider to find out what you need help with and what your safety needs  are.  Keep all follow-up visits as told by your health care provider. This is important. Where to find more information  [employer] on Aging: http://kim-miller.com/ Contact a health care provider if:  You have any new symptoms. Get help right away if:  You develop new confusion or your confusion gets worse.  You act in ways that place you or your family in danger. Summary  Mild neurocognitive disorder is a disorder in which memory does not work as well as it should.  Mild neurocognitive disorder can have many causes. It may be the first stage of dementia.  To manage your condition, get regular exercise, keep your mind active, get quality sleep, and eat a healthy diet. This information is not intended to replace advice given to you by your health care provider. Make sure you discuss any questions you have with your health care provider. Document Revised: 10/17/2018 Document Reviewed: 10/17/2018 Elsevier Patient Education  [place].

## 2020-01-29 NOTE — Progress Notes (Addendum)
[facility]    Provider:  Dr Jaynee Eagles Requesting Provider: Shon Baton, MD Primary Care Provider:  Shon Baton, MD  CC:  Memory concerns for about 5 years  HPI:  Laura Maxwell is a 84 y.o. female here as requested by Shon Baton, MD for memory loss.  Type 2 diabetes, vitamin D deficiency, hypertension.  Daughter provides most info and says they first noted she was repeating things, even in the same conversation or sitting. There are some events that she does not remember like from a year ago.etails and dates she forgets, she forgot things at church due to the date being wrong in her head.She missed a senior lunch. With driving she has missed her Rheumatology appointment going to the wrong strees. She has accidentally canceled by hitting 2 and went to behavioral health building. Ongoing for 5 years, slowly progressive and more short term memory loss. Father dies in 71s, mother dies at 74 and had "mini strokes" and she had memory problems. They have started collecting their mail and daughter helps the,m with it but they pay them themselves and nothing known that as been missing or double paid. She uses her debit card eveywhere she does. They will write check to power and others and her husband does that.They eat out all the time with her husband. Needing assistance with recipes. No accidents n the home or leaving the stove on. Lives with husband who is retired. Home is cluttered and they have a dog. No alcohol. No depression/anxiety.  Symptoms are progressively worsening.  Reviewed notes, labs and imaging from outside physicians, which showed:  I reviewed [place] notes.  Patient presented last with memory loss, reports daughter think she has Alzheimer's, she forgets things but she has done this all her life, friends at church have similar issues, patient reports that her family has concerned about her breakfast casserole they were out at the beach and she does not  remember making the casserole, also does not member where she puts keys, patient son gave her some flowers for her BD flowers were put by the back door and patient spouse did not remember who the flowers came from, patient and spouse are responsible paying their bills, sent for evaluation of memory changes.  Review of Systems: Patient complains of symptoms per HPI as well as the following symptoms memory loss. Pertinent negatives and positives per HPI. All others negative.   Social History   Socioeconomic History  . Marital status: Married    Spouse name: Not on file  . Number of children: 2  . Years of education: Not on file  . Highest education level: High school graduate  Occupational History  . Occupation: retired  Tobacco Use  . Smoking status: Never Smoker  . Smokeless tobacco: Never Used  Vaping Use  . Vaping Use: Never used  Substance and Sexual Activity  . Alcohol use: Never  . Drug use: Never  . Sexual activity: Not on file  Other Topics Concern  . Not on file  Social History Narrative   Lives at home with husband   Right handed   Caffeine: sometimes in soft drinks    Social Determinants of Health   Financial Resource Strain:   . Difficulty of Paying Living Expenses: Not on file  Food Insecurity:   . Worried About Charity fundraiser in the Last Year: Not on file  . Ran Out of Food in the Last Year: Not on file  Transportation Needs:   .  Lack of Transportation (Medical): Not on file  . Lack of Transportation (Non-Medical): Not on file  Physical Activity:   . Days of Exercise per Week: Not on file  . Minutes of Exercise per Session: Not on file  Stress:   . Feeling of Stress : Not on file  Social Connections:   . Frequency of Communication with Friends and Family: Not on file  . Frequency of Social Gatherings with Friends and Family: Not on file  . Attends Religious Services: Not on file  . Active Member of Clubs or Organizations: Not on file  . Attends  Archivist Meetings: Not on file  . Marital Status: Not on file  Intimate Partner Violence:   . Fear of Current or Ex-Partner: Not on file  . Emotionally Abused: Not on file  . Physically Abused: Not on file  . Sexually Abused: Not on file    Family History  Problem Relation Age of Onset  . Breast cancer Mother   . Bone cancer Mother   . Heart Problems Mother   . Other Mother        "mini strokes"  . Emphysema Father   . Prostate cancer Father   . Colon cancer Sister   . Diabetes Sister   . Heart disease Brother   . Diabetes Brother   . Dementia Neg Hx   . Alzheimer's disease Neg Hx     Past Medical History:  Diagnosis Date  . Allergic    Pt is under care of allergy doctor due to swelling of tongue-unsure of med  . Cataract    bil/had surgery  . Chronic cystitis    per [facility]  . Colon polyp    recurrent; per [facility]  . CTS (carpal tunnel syndrome)    per [facility]  . Diabetes mellitus without complication ([place])   . Diverticulosis   . Fatty liver    per [facility]  . H/O vertigo    per [facility]  . Hyperlipidemia   . Hypertension   . Neckache    per [facility]  . OSA (obstructive sleep apnea)    Mild; per [facility]  . Osteoarthritis    per [facility]  . Osteopenia    per [facility]  . Rheumatoid arthritis ([place])    pt states she has been told she is in remission    Patient Active Problem List   Diagnosis Date Noted  . Mild cognitive impairment with memory loss 01/29/2020  . Angioedema 01/20/2013  . HTN (hypertension) 01/20/2013    Past Surgical History:  Procedure Laterality Date  . ABDOMINAL SURGERY     benign tumor  . bilateral cataracts  2012   [facility]  . BUNIONECTOMY     right foot  . DILATION AND CURETTAGE OF UTERUS    . DILATION AND CURETTAGE OF  UTERUS    . eyelid retraction      @ [facility]; per [facility]  . I&D of 4 Aptic Lesions (MRSA?)     per [facility]  . ROTATOR CUFF REPAIR     right  . uterine polypectomy     per [facility]    Current Outpatient Medications  Medication Sig Dispense Refill  . amLODipine (NORVASC) 5 MG tablet Take 5 mg by mouth at bedtime.     Marland Kitchen Apoaequorin (PREVAGEN) 10 MG CAPS Take 1 capsule  by mouth daily.    . Cholecalciferol (VITAMIN D3) 50 MCG (2000 UT) TABS Take by mouth.    . clobetasol cream (TEMOVATE) 9.38 % Place 1 application vaginally daily as needed for itching.    . Coenzyme Q10 (CO Q 10) 100 MG CAPS Take 1 capsule by mouth daily.    . folic acid (FOLVITE) 1 MG tablet Take 1 mg by mouth daily.    Marland Kitchen JARDIANCE 10 MG TABS tablet Take 10 mg by mouth daily before breakfast.    . labetalol (NORMODYNE) 300 MG tablet Take 300 mg by mouth 2 (two) times daily.     . meloxicam (MOBIC) 15 MG tablet Take 15 mg by mouth daily.    . metFORMIN (GLUCOPHAGE) 500 MG tablet Take 500 mg by mouth daily with breakfast.    . methotrexate (RHEUMATREX) 2.5 MG tablet Take 15 mg by mouth once a week.    . Multiple Vitamins-Minerals (ONE-A-DAY WOMENS 50 PLUS) TABS Take 1 tablet by mouth daily with breakfast.    . Omega-3 Fatty Acids (FISH OIL) 1200 MG CAPS Take 1 capsule by mouth daily.    . rosuvastatin (CRESTOR) 5 MG tablet Take 5 mg by mouth. Daily for 5 days per week.    . spironolactone (ALDACTONE) 25 MG tablet Take 25 mg by mouth daily. 6 days per week. Skips Sunday.    Marland Kitchen VITAMIN E PO Take 1 capsule by mouth 2 (two) times a week.    Marland Kitchen EPINEPHrine 0.3 mg/0.3 mL IJ SOAJ injection Inject 0.3 mg into the muscle as needed for anaphylaxis.     Current Facility-Administered Medications  Medication Dose Route Frequency Provider Last Rate Last Admin  . 0.9 %  sodium chloride infusion  500 mL Intravenous Once Ladene Artist, MD        Allergies as of 01/29/2020 -  Review Complete 01/29/2020  Allergen Reaction Noted  . Ace inhibitors Swelling and Other (See Comments) 10/05/2011  . Phenazopyridine Swelling and Other (See Comments) 10/05/2011    Vitals: BP 127/71 (BP Location: Right Arm, Patient Position: Sitting)   Pulse 73   Ht 5' 5.5" (1.664 m)   Wt 177 lb (80.3 kg)   BMI 29.01 kg/m  Last Weight:  Wt Readings from Last 1 Encounters:  01/29/20 177 lb (80.3 kg)   Last Height:   Ht Readings from Last 1 Encounters:  01/29/20 5' 5.5" (1.664 m)     Physical exam: Exam: Gen: NAD, conversant, well nourised,  well groomed                     CV: RRR, no MRG. No Carotid Bruits. No peripheral edema, warm, nontender Eyes: Conjunctivae clear without exudates or hemorrhage  Neuro: Detailed Neurologic Exam  Speech:    Speech is normal; fluent and spontaneous with normal comprehension.  Cognition:    MMSE - Mini Mental State Exam 01/29/2020  Orientation to time 5  Orientation to Place 4  Registration 3  Attention/ Calculation 4  Recall 2  Language- name 2 objects 2  Language- repeat 1  Language- follow 3 step command 3  Language- read & follow direction 1  Write a sentence 1  Copy design 1  Total score 27       The pupils are equal, round, and reactive to light.pupils too small to visualize fundi  Visual fields are full to finger confrontation. Extraocular movements are intact. Trigeminal sensation is intact and the muscles of mastication are normal. The  face is symmetric. The palate elevates in the midline. Hearing intact. Voice is normal. Shoulder shrug is normal. The tongue has normal motion without fasciculations.   Coordination:    No dysmetria or ataxia  Gait:    Normal native gait  Motor Observation:    No asymmetry, no atrophy, and no involuntary movements noted. Tone:    Normal muscle tone.    Posture:    Posture is normal. normal erect    Strength:    Strength is V/V in the upper and lower limbs.      Sensation:  intact to LT     Reflex Exam:  DTR's:    Deep tendon reflexes in the upper and lower extremities are symmetrical bilaterally.   Toes:    The toes are downgoing bilaterally.   Clonus:    Clonus is absent.    Assessment/Plan:  84 year old with likely mild cognitive impairment with memory loss/mild neurodegenerative disorder, May be prodromal Alzheimerm's but needs evaluation. MMSE 27/30 moreso short-term memory loss.  Orders Placed This Encounter  Procedures  . MR BRAIN W WO CONTRAST  . B12 and Folate Panel  . Methylmalonic acid, serum  . Vitamin B1  . Homocysteine  . CBC with Differential/Platelets  . Comprehensive metabolic panel  . TSH  . Ambulatory referral to Neuropsychology   RTC after workup above to discuss finding and treatment  Cc: Shon Baton, MD,  Shon Baton, MD  Sarina Ill, MD  [facility] [address] [place], [place] [address]  Phone [phone] Fax [phone]  I spent 40 minutes of face-to-face and non-face-to-face time with patient on the  1. Mild cognitive impairment with memory loss    diagnosis.  This included previsit chart review, lab review, study review, order entry, electronic health record documentation, patient education on the different diagnostic and therapeutic options, counseling and coordination of care, risks and benefits of management, compliance, or risk factor reduction

## 2020-01-30 ENCOUNTER — Telehealth: Payer: Self-pay | Admitting: Neurology

## 2020-01-30 NOTE — Addendum Note (Signed)
Addended by: Sarina Ill B on: 01/30/2020 10:32 AM   Modules accepted: Level of Service

## 2020-01-30 NOTE — Telephone Encounter (Signed)
Laura Maxwell Laura Maxwell: [id number] (exp. 01/30/20 to 02/29/20) order sent to GI. They will reach out to the patient to schedule.

## 2020-02-01 ENCOUNTER — Encounter: Payer: Self-pay | Admitting: Psychology

## 2020-02-02 ENCOUNTER — Other Ambulatory Visit: Payer: Self-pay

## 2020-02-02 ENCOUNTER — Ambulatory Visit
Admission: RE | Admit: 2020-02-02 | Discharge: 2020-02-02 | Disposition: A | Discharge status: Home / Self Care | Payer: Medicare PPO | Source: Ambulatory Visit | Attending: Neurology | Admitting: Neurology

## 2020-02-02 DIAGNOSIS — G3184 Mild cognitive impairment, so stated: Secondary | ICD-10-CM

## 2020-02-02 MED ORDER — GADOBENATE DIMEGLUMINE 529 MG/ML IV SOLN
15.0000 mL | Freq: Once | INTRAVENOUS | Status: AC | PRN
  Administered 2020-02-02: 15 mL via INTRAVENOUS

## 2020-02-05 LAB — COMPREHENSIVE METABOLIC PANEL
ALT: 21 IU/L (ref 0–32)
AST: 14 IU/L (ref 0–40)
Albumin/Globulin Ratio: 2.2 (ref 1.2–2.2)
Albumin: 4.4 g/dL (ref 3.6–4.6)
Alkaline Phosphatase: 99 IU/L (ref 44–121)
BUN/Creatinine Ratio: 20 (ref 12–28)
BUN: 16 mg/dL (ref 8–27)
Bilirubin Total: 0.3 mg/dL (ref 0.0–1.2)
CO2: 24 mmol/L (ref 20–29)
Calcium: 10 mg/dL (ref 8.7–10.3)
Chloride: 104 mmol/L (ref 96–106)
Creatinine, Ser: 0.82 mg/dL (ref 0.57–1.00)
GFR calc Af Amer: 77 mL/min/{1.73_m2} (ref >59)
GFR calc non Af Amer: 67 mL/min/{1.73_m2} (ref >59)
Globulin, Total: 2 g/dL (ref 1.5–4.5)
Glucose: 212 mg/dL — ABNORMAL HIGH (ref 65–99)
Potassium: 4.6 mmol/L (ref 3.5–5.2)
Sodium: 141 mmol/L (ref 134–144)
Total Protein: 6.4 g/dL (ref 6.0–8.5)

## 2020-02-05 LAB — CBC WITH DIFFERENTIAL/PLATELET
Basophils Absolute: 0.1 10*3/uL (ref 0.0–0.2)
Basos: 1 %
EOS (ABSOLUTE): 0.1 10*3/uL (ref 0.0–0.4)
Eos: 1 %
Hematocrit: 46 % (ref 34.0–46.6)
Hemoglobin: 15.7 g/dL (ref 11.1–15.9)
Immature Grans (Abs): 0 10*3/uL (ref 0.0–0.1)
Immature Granulocytes: 0 %
Lymphocytes Absolute: 1.4 10*3/uL (ref 0.7–3.1)
Lymphs: 14 %
MCH: 32.1 pg (ref 26.6–33.0)
MCHC: 34.1 g/dL (ref 31.5–35.7)
MCV: 94 fL (ref 79–97)
Monocytes Absolute: 1 10*3/uL — ABNORMAL HIGH (ref 0.1–0.9)
Monocytes: 10 %
Neutrophils Absolute: 8 10*3/uL — ABNORMAL HIGH (ref 1.4–7.0)
Neutrophils: 74 %
Platelets: 269 10*3/uL (ref 150–450)
RBC: 4.89 x10E6/uL (ref 3.77–5.28)
RDW: 11.9 % (ref 11.7–15.4)
WBC: 10.7 10*3/uL (ref 3.4–10.8)

## 2020-02-05 LAB — VITAMIN B1: Thiamine: 176.1 nmol/L (ref 66.5–200.0)

## 2020-02-05 LAB — B12 AND FOLATE PANEL
Folate: 10.6 ng/mL (ref >3.0)
Vitamin B-12: 394 pg/mL (ref 232–1245)

## 2020-02-05 LAB — METHYLMALONIC ACID, SERUM: Methylmalonic Acid: 248 nmol/L (ref 0–378)

## 2020-02-05 LAB — HOMOCYSTEINE: Homocysteine: 17.3 umol/L (ref 0.0–21.3)

## 2020-02-05 LAB — TSH: TSH: 1.84 u[IU]/mL (ref 0.450–4.500)

## 2020-02-06 ENCOUNTER — Telehealth: Payer: Self-pay | Admitting: *Deleted

## 2020-02-06 NOTE — Telephone Encounter (Signed)
-----   Message from Melvenia Beam, MD sent at 02/03/2020  6:11 PM EDT ----- She has some mild brain volume loss in an area that we associate with memory loss but nothing else concerning on MRI of the brain, otherwise normal for age. Please move forward with the formal memory testing. thanks

## 2020-02-06 NOTE — Telephone Encounter (Signed)
Tried to reach pt. Phone rang multiple times and I never received an option to LVM. I called pt's cell and LVM (ok per DPR) with MRI brain results. Left office number for call back if needed. I called the daughter Marcie Bal (on [place]) and discussed the results of MRI brain and advised for pt to proceed with formal memory testing which is already scheduled for 05/09/20. She verbalized understanding and appreciation for the call. She did not have any questions at the time of the call.

## 2020-03-01 DIAGNOSIS — M2550 Pain in unspecified joint: Secondary | ICD-10-CM | POA: Diagnosis not present

## 2020-03-11 DIAGNOSIS — M2550 Pain in unspecified joint: Secondary | ICD-10-CM | POA: Diagnosis not present

## 2020-03-11 DIAGNOSIS — Z79899 Other long term (current) drug therapy: Secondary | ICD-10-CM | POA: Diagnosis not present

## 2020-03-18 DIAGNOSIS — K760 Fatty (change of) liver, not elsewhere classified: Secondary | ICD-10-CM | POA: Diagnosis not present

## 2020-03-18 DIAGNOSIS — M069 Rheumatoid arthritis, unspecified: Secondary | ICD-10-CM | POA: Diagnosis not present

## 2020-03-18 DIAGNOSIS — M1990 Unspecified osteoarthritis, unspecified site: Secondary | ICD-10-CM | POA: Diagnosis not present

## 2020-03-18 DIAGNOSIS — E119 Type 2 diabetes mellitus without complications: Secondary | ICD-10-CM | POA: Diagnosis not present

## 2020-03-18 DIAGNOSIS — E785 Hyperlipidemia, unspecified: Secondary | ICD-10-CM | POA: Diagnosis not present

## 2020-03-18 DIAGNOSIS — R413 Other amnesia: Secondary | ICD-10-CM | POA: Diagnosis not present

## 2020-03-18 DIAGNOSIS — E669 Obesity, unspecified: Secondary | ICD-10-CM | POA: Diagnosis not present

## 2020-03-18 DIAGNOSIS — I10 Essential (primary) hypertension: Secondary | ICD-10-CM | POA: Diagnosis not present

## 2020-03-18 DIAGNOSIS — D8989 Other specified disorders involving the immune mechanism, not elsewhere classified: Secondary | ICD-10-CM | POA: Diagnosis not present

## 2020-03-25 ENCOUNTER — Emergency Department (HOSPITAL_COMMUNITY)
Admission: EM | Admit: 2020-03-25 | Discharge: 2020-03-25 | Disposition: A | Discharge status: Home / Self Care | Payer: Medicare PPO | Attending: Emergency Medicine | Admitting: Emergency Medicine

## 2020-03-25 ENCOUNTER — Other Ambulatory Visit: Payer: Self-pay

## 2020-03-25 DIAGNOSIS — L299 Pruritus, unspecified: Secondary | ICD-10-CM | POA: Insufficient documentation

## 2020-03-25 DIAGNOSIS — Z5321 Procedure and treatment not carried out due to patient leaving prior to being seen by health care provider: Secondary | ICD-10-CM | POA: Insufficient documentation

## 2020-03-25 NOTE — ED Notes (Signed)
Pt left ED, wrist band cut off. Stated she didn't want to wait any longer.

## 2020-03-25 NOTE — ED Triage Notes (Addendum)
Pt presents to ED POV. Pt c/o itching in palms and tingling in back of throat. Pt denies any known allergin exposures. Airway intact. S/s have subsided since

## 2020-04-08 ENCOUNTER — Institutional Professional Consult (permissible substitution): Payer: No Typology Code available for payment source | Admitting: Neurology

## 2020-05-09 ENCOUNTER — Encounter: Payer: Medicare PPO | Attending: Psychology | Admitting: Psychology

## 2020-05-09 ENCOUNTER — Other Ambulatory Visit: Payer: Self-pay

## 2020-05-09 ENCOUNTER — Encounter: Payer: Self-pay | Admitting: Psychology

## 2020-05-09 DIAGNOSIS — G3184 Mild cognitive impairment, so stated: Secondary | ICD-10-CM | POA: Insufficient documentation

## 2020-05-09 NOTE — Progress Notes (Signed)
Neuropsychological Consultation   Patient:   Laura Maxwell   DOB:   05-27-1935  MR Number:  [id number]  Location:  [facility] [place], [place] [id number] [place] [address] Dept: [phone]           Date of Service:   05/09/2020  Start Time:   1 PM End Time:   3 PM  Today's visit consisted of an in person visit that was conducted in my outpatient clinic office.  1 hour and 15 minutes were spent in clinical interview with patient and her daughter as well as 45 minutes with records review, report writing and setting up testing protocols.  Provider/Observer:  Ilean Skill, Psy.D.       Clinical Neuropsychologist       Billing Code/Service: 96116/96121  Chief Complaint:    Laura Maxwell is an 85 year old female who was referred for neuropsychological evaluation by Sarina Ill, MD as part of a larger neurological work-up requested of her by the patient's PCP Shon Baton, MD due to memory loss.  The patient has a past medical history including type 2 diabetes that is being managed, vitamin D deficiency recent diagnosis of rheumatoid arthritis, and hypertension.  Per patient's daughter report and reluctantly agreed to by the patient the patient has been experiencing increasing issues with memory changes that have been progressing over the past for 5 years.  The patient minimizes some of her difficulties and attributes them to age-related changes.  The patient does acknowledge forgetting things that other people typically forget with age.  The patient denies any changes in language or geographic orientation.  There are no indications by the patient is a daughter of tremor or lateralized motor changes and no visual or auditory hallucinations.  Reason for Service:  Laura Maxwell is an 85 year old female who was referred for neuropsychological evaluation by Sarina Ill, MD  as part of a larger neurological work-up requested of her by the patient's PCP Shon Baton, MD due to memory loss.  The patient has a past medical history including type 2 diabetes that is being managed, vitamin D deficiency recent diagnosis of rheumatoid arthritis, and hypertension.  Per patient's daughter report and reluctantly agreed to by the patient the patient has been experiencing increasing issues with memory changes that have been progressing over the past for 5 years.  The patient minimizes some of her difficulties and attributes them to age-related changes.  The patient does acknowledge forgetting things that other people typically forget with age.  The patient denies any changes in language or geographic orientation.  There are no indications by the patient is a daughter of tremor or lateralized motor changes and no visual or auditory hallucinations.  The patient's daughter expounded on some of the symptoms that she has noticed with the patient.  The patient is described as having difficulty remembering details even during a singular conversation.  The patient is described as getting more confused and has difficulty keeping up with her own thoughts and what she is doing.  Several examples were given.  The patient has documented issues including times where she forgot things that church because of having the date wrong and missing doctors appointments or other issues.  There have been times where she is gone down the wrong streets but these are attributed to confusion with new traffic patterns and difficulty adapting versus clear geographic disorientation.  There are some indications  of difficulties or worsening of problem-solving skills and executive functioning.  No language changes are noted and the patient continues to have good verbal fluency.  Medically, the only major issue is managing hypertension and recent diagnosis of rheumatoid arthritis with pain.  Symptoms have been progressively changing  over the past for 5 years.  The patient is described as having a good appetite and that she and her husband tend to eat out a lot rather than preparing foods at home.  The patient was assessed 10 to 15 years ago for possible sleep apnea which was found apparently to be very mild but present and felt to be addressed with positional change when sleeping.  There have been no other assessments for this possible condition since that initial evaluation sometime ago.  The patient reports that she does feel like she sleeps well and feels rested in the morning.  She does acknowledge snoring and is being told to her by her husband.  The patient had an MRI of the brain conducted on 02/02/2020 which was interpreted by Arlice Colt, MD, PhD.  The impression of this MRI included mild right medial temporal lobe atrophy with other regions of the brain volume being normal for age.  There were also T2/flair hyperintense foci in the hemispheres consistent with mild chronic microvascular ischemic change.  There were no indications of acute findings.  Behavioral Observation: Laura Maxwell  presents as a 85 y.o.-year-old Right Caucasian Female who appeared her stated age. her dress was Appropriate and she was Well Groomed and her manners were Appropriate to the situation.  her participation was indicative of Appropriate and Redirectable behaviors.  There were not physical disabilities noted.  she displayed an appropriate level of cooperation and motivation.     Interactions:    Active Appropriate and Redirectable  Attention:   abnormal and attention span appeared shorter than expected for age  Memory:   abnormal; remote memory intact, recent memory impaired  Visuo-spatial:  not examined  Speech (Volume):  normal  Speech:   normal; normal  Thought Process:  Coherent and Relevant  Though Content:  WNL; not suicidal and not homicidal  Orientation:   person, place and  time/date  Judgment:   Fair  Planning:   Fair  Affect:    Appropriate  Mood:    Euthymic  Insight:   Fair  Intelligence:   normal  Marital Status/Living: The patient was born and raised in [place] along with 4 siblings.  Typical childhood illnesses were noted including measles, mumps and having her tonsils removed.  The patient currently lives with her husband of 49 years and they have 2 adult children age 37 and 33.  They both appear to be managing ADLs adequately.  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  Substance Use:  No concerns of substance abuse are reported.  Education:   HS Graduate the patient attended [employer] and [employer] in high school and had extracurricular activities including cheerleading, basketball, softball and playing the piano.  Medical History:   Past Medical History:  Diagnosis Date  . Allergic    Pt is under care of allergy doctor due to swelling of tongue-unsure of med  . Cataract    bil/had surgery  . Chronic cystitis    per [facility]  . Colon polyp  recurrent; per [facility]  . CTS (carpal tunnel syndrome)    per [facility]  . Diabetes mellitus without complication ([place])   . Diverticulosis   . Fatty liver    per [facility]  . H/O vertigo    per [facility]  . Hyperlipidemia   . Hypertension   . Neckache    per [facility]  . OSA (obstructive sleep apnea)    Mild; per [facility]  . Osteoarthritis    per [facility]  . Osteopenia    per [facility]  . Rheumatoid arthritis ([place])    pt states she has been told she is in remission         Patient Active Problem List   Diagnosis Date Noted  . Mild cognitive  impairment with memory loss 01/29/2020  . Angioedema 01/20/2013  . HTN (hypertension) 01/20/2013              Abuse/Trauma History: No indication history of abuse or trauma.  Psychiatric History:  No prior psychiatric history noted.  No significant emotional distress related to depression or anxiety or other psychiatric symptoms noted.  Family Med/Psych History:  Family History  Problem Relation Age of Onset  . Breast cancer Mother   . Bone cancer Mother   . Heart Problems Mother   . Other Mother        "mini strokes"  . Emphysema Father   . Prostate cancer Father   . Colon cancer Sister   . Diabetes Sister   . Heart disease Brother   . Diabetes Brother   . Dementia Neg Hx   . Alzheimer's disease Neg Hx     Impression/DX:  Laura Maxwell is an 85 year old female who was referred for neuropsychological evaluation by Sarina Ill, MD as part of a larger neurological work-up requested of her by the patient's PCP Shon Baton, MD due to memory loss.  The patient has a past medical history including type 2 diabetes that is being managed, vitamin D deficiency recent diagnosis of rheumatoid arthritis, and hypertension.  Per patient's daughter report and reluctantly agreed to by the patient the patient has been experiencing increasing issues with memory changes that have been progressing over the past for 5 years.  The patient minimizes some of her difficulties and attributes them to age-related changes.  The patient does acknowledge forgetting things that other people typically forget with age.  The patient denies any changes in language or geographic orientation.  There are no indications by the patient is a daughter of tremor or lateralized motor changes and no visual or auditory hallucinations.  At this point, there have been some indications on her recent MRI of greater atrophy in the medial right temporal lobe which is an area of the brain associated with memory and learning.  Other than  mild chronic microvascular ischemic changes no other structural abnormalities are noted.  Disposition/Plan:  We have set the patient up for formal neuropsychological testing and will start out with a foundational battery of the Wechsler Adult Intelligence Scale and the Wechsler Memory Scale's and once those are completed a determination as to other areas that may need to be objectively assessed will be determined.  Once this evaluation is completed a formal report will be produced and made available to her referring physician as well as being made available in her electronic medical records.  I will also schedule a time to sit down with  the patient and her family and go over the results as well.  Diagnosis:    Mild cognitive impairment with memory loss         Electronically Signed   _______________________ Ilean Skill, Psy.D. Clinical Neuropsychologist

## 2020-05-17 ENCOUNTER — Encounter: Payer: Self-pay | Admitting: Psychology

## 2020-05-17 ENCOUNTER — Encounter: Payer: Medicare PPO | Admitting: Psychology

## 2020-05-17 ENCOUNTER — Other Ambulatory Visit: Payer: Self-pay

## 2020-05-17 DIAGNOSIS — G3184 Mild cognitive impairment, so stated: Secondary | ICD-10-CM | POA: Diagnosis not present

## 2020-05-17 NOTE — Progress Notes (Addendum)
Neuropsychology Note Laura Maxwell [facility] completed 240 minutes of neuropsychological testing with this provider Health and safety inspector). The patient did not appear overtly distressed by the testing session, per behavioral observation or via self-report. Rest breaks were offered.   Tests Administered:   [place] (BNT)  Controlled Oral Word Association Test (COWAT; FAS & Animals)  Wechsler Adult Intelligence Scale (WAIS-IV)  Wechsler Memory Scale, 4th Edition, (WMS-IV) Older Adult Battery  Results:   Language:               Verbal Fluency Test: Raw Score  (T Score) Percentile Description   Phonemic Fluency (FAS) 42 (55) 69 Average  Animal Fluency 9 (31) 3 Well Below Average           Raw Score  (T Score) Percentile    [place] Naming Test (BNT): 53/60 (53) 62 Average           WAIS-IV Composite Score Summary  Scale Sum of Scaled Scores Composite Score Percentile Rank 95% Conf. Interval Qualitative Description  Verbal Comprehension 31 VCI 102 55 96-108 Average  Perceptual Reasoning 41 PRI 121 92 114-126 Superior  Working Memory 21 WMI 102 55 95-109 Average  Processing Speed 26 PSI 117 87 107-124 High Average  Full Scale 119 FSIQ 112 79 108-116 High Average  General Ability 72 GAI 112 79 107-117 High Average    Verbal Comprehension Subtests Summary  Subtest Raw Score Scaled Score Percentile Rank Reference Group Scaled Score SEM  Similarities 19 9 37 7 0.90  Vocabulary 41 12 75 12 0.73  Information 13 10 50 10 0.73    Perceptual Reasoning Subtests Summary  Subtest Raw Score Scaled Score Percentile Rank Reference Group Scaled Score SEM  Block Design 35 13 84 8 1.34  Matrix Reasoning 17 15 95 9 1.12  Visual Puzzles 12 13 84 8 1.27    Working Doctor, general practice Raw Score Scaled Score Percentile Rank Reference Group Scaled Score SEM  Digit Span 27 12 75 9 0.85  Arithmetic 11 9 37 8 0.99    Working Memory Process Score Summary  Process Score  Raw Score Scaled Score Percentile Rank Base Rate SEM  Digit Span Forward 9 9 37 -- 1.31  Digit Span Backward 9 13 84 -- 1.44  Digit Span Sequencing 9 13 84 -- 1.20  Longest Digit Span Forward 6 -- -- 66.0 --  Longest Digit Span Backward 5 -- -- 33.0 --  Longest Digit Span Sequence 6 -- -- 36.0 --    Processing Speed Subtests Summary  Subtest Raw Score Scaled Score Percentile Rank Reference Group Scaled Score SEM  Symbol Search 27 14 91 8 1.12  Coding 49 12 75 6 1.12   Index Score Summary  Index Sum of Scaled Scores Index Score Percentile Rank 95% Confidence Interval Qualitative Descriptor  Auditory Memory (AMI) 32 88 21 82-95 Low Average  Visual Memory (VMI) 12 79 8 75-85 Borderline  Immediate Memory (IMI) 23 85 16 80-92 Low Average  Delayed Memory (DMI) 21 81 10 75-90 Low Average    Primary Subtest Scaled Score Summary  Subtest Domain Raw Score Scaled Score Percentile Rank  Logical Memory I AM 30 11 63  Logical Memory II AM 5 6 9   Verbal Paired Associates I AM 9 6 9   Verbal Paired Associates II AM 4 9 37  Visual Reproduction I VM 15 6 9   Visual Reproduction II VM 3 6 9   Symbol Span VWM 15 11 63  Auditory Memory Process Score Summary  Process Score Raw Score Scaled Score Percentile Rank Cumulative Percentage (Base Rate)  LM II Recognition 15 - - 17-25%  VPA II Recognition 19 - - 3-9%    Visual Memory Process Score Summary  Process Score Raw Score Scaled Score Percentile Rank Cumulative Percentage (Base Rate)  VR II Recognition 2 - - 17-25%    Predicted Difference Method   Index Predicted WMS-IV Index Score Actual WMS-IV Index Score Difference Critical Value  Significant Difference Y/N Base Rate  Auditory Memory 106 88 18 9.33 Y 5-10%  Visual Memory 107 79 28 7.72 Y 1-2%  Immediate Memory 108 85 23 10.41 Y 2-3%  Delayed Memory 107 81 26 10.86 Y 2%  Statistical significance (critical value) at the .01 level.    Feedback with Patient:  Laura Maxwell will return on 06/20/20 for an interactive feedback session with Dr.  Sima Matas at which time her test performances, clinical impressions and treatment recommendations will be reviewed in detail. The patient understands she can contact our office should she require our assistance before this time.  Full report to follow.

## 2020-06-13 ENCOUNTER — Encounter: Payer: Medicare PPO | Attending: Psychology | Admitting: Psychology

## 2020-06-13 ENCOUNTER — Other Ambulatory Visit: Payer: Self-pay

## 2020-06-13 DIAGNOSIS — F0280 Dementia in other diseases classified elsewhere without behavioral disturbance: Secondary | ICD-10-CM | POA: Diagnosis not present

## 2020-06-13 DIAGNOSIS — G301 Alzheimer's disease with late onset: Secondary | ICD-10-CM | POA: Diagnosis not present

## 2020-06-18 DIAGNOSIS — E663 Overweight: Secondary | ICD-10-CM | POA: Diagnosis not present

## 2020-06-18 DIAGNOSIS — Z6829 Body mass index (BMI) 29.0-29.9, adult: Secondary | ICD-10-CM | POA: Diagnosis not present

## 2020-06-18 DIAGNOSIS — M2550 Pain in unspecified joint: Secondary | ICD-10-CM | POA: Diagnosis not present

## 2020-06-18 DIAGNOSIS — Z79899 Other long term (current) drug therapy: Secondary | ICD-10-CM | POA: Diagnosis not present

## 2020-06-18 DIAGNOSIS — M0609 Rheumatoid arthritis without rheumatoid factor, multiple sites: Secondary | ICD-10-CM | POA: Diagnosis not present

## 2020-06-18 DIAGNOSIS — R5382 Chronic fatigue, unspecified: Secondary | ICD-10-CM | POA: Diagnosis not present

## 2020-06-20 ENCOUNTER — Encounter: Payer: Medicare PPO | Admitting: Psychology

## 2020-06-20 ENCOUNTER — Encounter: Payer: Self-pay | Admitting: Psychology

## 2020-06-20 ENCOUNTER — Other Ambulatory Visit: Payer: Self-pay

## 2020-06-20 DIAGNOSIS — F0280 Dementia in other diseases classified elsewhere without behavioral disturbance: Secondary | ICD-10-CM

## 2020-06-20 DIAGNOSIS — G301 Alzheimer's disease with late onset: Secondary | ICD-10-CM

## 2020-06-20 NOTE — Progress Notes (Signed)
Neuropsychological Evaluation   Patient:  Laura Maxwell   DOB: Jun 11, 1935  MR Number: [id number]  Location: [facility] PHYSICAL MEDICINE AND REHABILITATION [place], [place] [id number] [facility] [place] [address] Dept: [phone]  Start: 8 AM End: 9 AM  Provider/Observer:     Edgardo Roys PsyD  Chief Complaint:      Chief Complaint  Patient presents with  . Memory Loss    Reason For Service:    Laura Maxwell is an 85 year old female who was referred for neuropsychological evaluation by Sarina Ill, MD as part of a larger neurological work-up requested of her by the patient's PCP Shon Baton, MD due to memory loss.  The patient has a past medical history including type 2 diabetes that is being well managed, vitamin D deficiency, recent diagnosis of rheumatoid arthritis, and hypertension.  Per patient's daughter report and reluctantly agreed to by the patient, the patient has been experiencing increasing issues with memory changes that have been progressing over the past for 5 years.  The patient minimizes some of her difficulties and attributes them to age-related changes.  The patient does acknowledge forgetting things that other people typically forget with age.  The patient denies any changes in language or geographic orientation.  There are no indications by the patient's daughter of tremor or lateralized motor changes and no visual or auditory hallucinations.  The patient's daughter expounded on some of the symptoms that she has noticed with the patient.  The patient is described as having difficulty remembering details even during a singular conversation.  The patient is described as getting more confused and has difficulty keeping up with her own thoughts and what she is doing.  Several examples were given.  The patient has documented issues including times where she forgot things at church because of  having the date wrong and missing doctors appointments or other issues.  There have been times where she is gone down the wrong streets but these are attributed to confusion with new traffic patterns and difficulty adapting versus clear geographic disorientation.  There are some indications of difficulties or worsening of problem-solving skills and executive functioning.  No language changes are noted and the patient continues to have good verbal fluency.  Medically, the only major issue is managing hypertension and recent diagnosis of rheumatoid arthritis with pain.  Symptoms have been progressively changing over the past for 5 years.  The patient is described as having a good appetite and that she and her husband tend to eat out a lot rather than preparing foods at home.  The patient was assessed 10 to 15 years ago for possible sleep apnea which was found apparently to be very mild but present and felt to be addressed with positional change when sleeping.  There have been no other assessments for this possible condition since that initial evaluation sometime ago.  The patient reports that she does feel like she sleeps well and feels rested in the morning.  She does acknowledge snoring and being told to her by her husband.  The patient had an MRI of the brain conducted on 02/02/2020 which was interpreted by Arlice Colt, MD, PhD.  The impression of this MRI included mild right medial temporal lobe atrophy with other regions of the brain volume being normal for age.  There were also T2/flair hyperintense foci in the hemispheres consistent with mild chronic microvascular ischemic change.  There were no indications of  acute findings.  Test Results:   Laura Maxwell [facility] completed 240 minutes of neuropsychological testing with this provider Health and safety inspector). The patient did not appear overtly distressed by the testing session, per behavioral observation or via self-report. Rest breaks were offered.    Tests Administered:   [place] (BNT)  Controlled Oral Word Association Test (COWAT; FAS & Animals)  Wechsler Adult Intelligence Scale (WAIS-IV)  Wechsler Memory Scale, 4th Edition, (WMS-IV) Older Adult Battery  Results:   Initially, an estimation was made as to the patient's historical/premorbid intellectual and cognitive functioning ranges.  Looking at the patient's occupational history and educational history it is estimated that the patient's likely historical cognitive functioning was in the high average range with conservative estimates of baseline cognitive functioning to follow-up on standard T-scores typically between the average (110) to the high average range 115/120 T score ranges.  We will utilize these comparisons for assessment purposes.  Language:          Verbal Fluency Test: Raw Score  (T Score) Percentile Description  Phonemic Fluency (FAS) 42 (55) 69 Average  Animal Fluency 9 (31) 3 Well Below Average         Raw Score  (T Score) Percentile   [place] Naming Test (BNT): 53/60 (53) 62 Average        Initially, assessment of the patient's expressive language functioning including verbal fluency measures as well as targeted/cued naming were conducted.  The patient typically performed in the average range and with normative expectation on verbal fluency measures targeted to particular letters as well as targeted/cued naming.  The patient had significant difficulty with regard to animal naming but 2 of the 3 expressive language measures were within normal limits with no indication of significant expressive language deficits.  There were no subjective reports of word finding difficulties noted by either the patient or her daughter and during the formal clinical interview no expressive language deficits were noted.           WAIS-IV Composite Score Summary   Scale Sum of Scaled Scores Composite Score Percentile Rank 95% Conf. Interval  Qualitative Description  Verbal Comprehension 31 VCI 102 55 96-108 Average  Perceptual Reasoning 41 PRI 121 92 114-126 Superior  Working Memory 21 WMI 102 55 95-109 Average  Processing Speed 26 PSI 117 87 107-124 High Average  Full Scale 119 FSIQ 112 79 108-116 High Average  General Ability 72 GAI 112 79 107-117 High Average   The patient was initially administered the Wechsler Adult Intelligence Scale to provide a structured well normed baseline of measures that assesses a wide range of cognitive domains.  The patient showed good performance on all of these composite scores and generally performed within expected levels with no indication of any specific deficits.  The patient produced a full-scale IQ score of 112 which fell at the 79th percentile and was in the high average range.  We also calculated the patient's general ability score which places less emphasis on measures that are most susceptible to acute changes.  The patient produced a general abilities index score of 112 as well which also fell at the 79th percentile in the high average range.  There were no indications of global intellectual or cognitive deficits noted.         Verbal Comprehension Subtests Summary   Subtest Raw Score Scaled Score Percentile Rank Reference Group Scaled Score SEM  Similarities 19 9 37 7 0.90  Vocabulary 41 12 75 12 0.73  Information  13 10 50 10 0.73   The patient produced a verbal comprehension index score of 102 which falls at the 55th percentile and is in the average range.  The patient did well with regard to her general fund of information and her vocabulary knowledge and also performed in the average range with regard to verbal reasoning and problem-solving although her verbal reasoning was slightly below predicted levels based on premorbid variables.         Perceptual Reasoning Subtests Summary   Subtest Raw Score Scaled Score Percentile Rank Reference Group Scaled Score SEM  Block Design 35  13 84 8 1.34  Matrix Reasoning 17 15 95 9 1.12  Visual Puzzles 12 13 84 8 1.27    The patient produced a perceptual reasoning index score of 121 which fell at the 92nd percentile and is in the superior range relative to a normative population.  This is equal to or better than even predicted levels.  The patient did exceptionally well on measures of visual analysis and organizational abilities, visual reasoning and problem solving, and visual estimation and judgment capacity.         Working Print production planner Raw Score Scaled Score Percentile Rank Reference Group Scaled Score SEM  Digit Span 27 12 75 9 0.85  Arithmetic 11 9 37 8 0.99    The patient produced a working memory index score of 102 which fell at the 55th percentile and was in the average range.  There was some degree of variability within subtest performance but generally she did quite well on auditory encoding capacity with no indications of any deficits with regard to encoding abilities.          Working Counsellor Score Summary   Process Score Raw Score Scaled Score Percentile Rank Base Rate SEM  Digit Span Forward 9 9 37 -- 1.31  Digit Span Backward 9 13 84 -- 1.44  Digit Span Sequencing 9 13 84 -- 1.20  Longest Digit Span Forward 6 -- -- 66.0 --  Longest Digit Span Backward 5 -- -- 33.0 --  Longest Digit Span Sequence 6 -- -- 36.0 --           Processing Speed Subtests Summary   Subtest Raw Score Scaled Score Percentile Rank Reference Group Scaled Score SEM  Symbol Search 27 14 91 8 1.12  Coding 49 12 75 6 1.12   The patient produced a processing speed index score of 117 which fell falls at the 87th percentile and is in the high average range relative to a normative population.  The patient did very well on measures of visual scanning, visual searching and overall speed of mental operations.  This does generally suggest that there are no significant white matter involved  deficits.   Index Score Summary   Index Sum of Scaled Scores Index Score Percentile Rank 95% Confidence Interval Qualitative Descriptor  Auditory Memory (AMI) 32 88 21 82-95 Low Average  Visual Memory (VMI) 12 79 8 75-85 Borderline  Immediate Memory (IMI) 23 85 16 80-92 Low Average  Delayed Memory (DMI) 21 81 10 75-90 Low Average    The patient was then administered the Wechsler Memory Scale-for for older adults.  The patient showed a very consistent and significant level of deficits on a wide range of memory and learning task.  This was true for both auditory and visual memory as well as her immediate memory and delayed memory.  On the Wechsler Adult Intelligence  Scale the patient did very well on auditory encoding measures and on the visual encoding measures from the Weschler memory scales the patient also did quite well.  There were no indications of any deficits with regard to initially encoding information and neither of auditory or visual sensory modality.  Breaking the patient's memory function is now between auditory versus visual memory the patient produced an auditory memory index score of 88 which falls at the 21st percentile and is in the low average range.  The patient produced a visual memory index score of 79 which falls at the 8th percentile and is in the borderline range of functioning.  This is roughly a 20-25 point difference between with predicted levels of functioning as well as global intellectual and cognitive functioning measures currently obtained.  There does not appear to be a significant difference between auditory versus visual memory with both auditory and visual memory performing well below predicted levels and indicating significant memory deficits.  Breaking the patient's memory functions down between immediate versus delayed aspects the patient produced an immediate memory index score of 85 which falls at the 16th percentile and is in the low average range.   Delayed memory index score was 81 which fell at the 10th percentile and was also in the low average range.  This overall pattern suggest that while the patient is able to effectively encode new information there are significant deficits in the process of organization and storage of information thus allowing for difficulties and later recall both immediately as well as after period of delay.         Auditory Memory Process Score Summary   Process Score Raw Score Scaled Score Percentile Rank Cumulative Percentage (Base Rate)  LM II Recognition 15 - - 17-25%  VPA II Recognition 19 - - 3-9%          Visual Memory Process Score Summary   Process Score Raw Score Scaled Score Percentile Rank Cumulative Percentage (Base Rate)  VR II Recognition 2 - - 17-25%   Unprocessed scores looking at recognition/cued memory functions noted above the patient showed consistent memory deficits even and cued/recognition formats.  The patient did not show any appreciable improvements under these recognition format suggesting that the memory deficits do have to do with primary deficits in storage and organization of information and not attributable to either encoding deficits or deficits with retrieval.           Predicted Difference Method   Index Predicted WMS-IV Index Score Actual WMS-IV Index Score Difference Critical Value  Significant Difference Y/N Base Rate  Auditory Memory 106 88 18 9.33 Y 5-10%  Visual Memory 107 79 28 7.72 Y 1-2%  Immediate Memory 108 85 23 10.41 Y 2-3%  Delayed Memory 107 81 26 10.86 Y 2%  Statistical significance (critical value) at the .01 level.    We also calculated the patient's predicted level of performance on various memory components utilizing her global abilities index score to produce a predicted level of performance based on current global cognitive performance.  This predicted score is then compared against the actual obtained score.  The patient's general  abilities index score allowed for predictions on various memory components to follow-up between the 106 and 108 standard index score range on various memory components.  As expected, there were significant differences between her predicted score and achieved score with differences between 18-28 standard points below predicted levels.  This highlights the significant difficulties with auditory memory doing somewhat better  than visual memory but both significantly below predicted levels.   Summary of Results:   The results of the current objective neuropsychological evaluation point out a very consistent pattern.  The patient did recently well on measures of expressive language functioning and she did exceptionally well on multiple aspects of cognitive functioning.  This was particularly true for the patient's information processing speed and visual scanning/visual searching abilities, the patient's visual spatial and visual constructional abilities and visual reasoning abilities as well as normal performance with regard to her overall verbal reasoning, general fund of information and vocabulary skills as well as her auditory encoding and visual encoding components.  There were some very slight relative weaknesses with regard to verbal reasoning components but these were not significant.  This well-preserved an excellent performance on various measures of cognitive performance outside of memory functions was in sharp contrast to significant deficits for both auditory and visual memory components.  The patient did quite well on auditory and visual encoding capacity but showed significant short-term and intermediate memory deficits for both auditory and visual memory capacity.  The patient did not show any significant improvement in function under cued recall formats therefore memory deficits appear to be quite specific with regard to deficits in storage and organization of information and not simply a function of  attentional deficits related to encoding impairments or deficits for retrieval of information that was actually effectively stored and organized.  Impression/Diagnosis:   Overall, the results of the current neuropsychological evaluation are quite consistent and primarily focally consistent.  Subjective reports by the patient's daughter and to a lesser degree the patient herself, medical data and neuro imaging results as well as objective neuropsychological test performance suggest primary and focal deficits having to do with both right and left temporal lobe functioning.  The patient did very well on measures of visual-spatial, visual reasoning and visual analysis capacities as well as overall information processing speed.  These 2 components place heavy emphasis on both right and left parietal lobe functioning as well as subcortical/white matter functioning.  The patient did very well on both auditory and visual encoding capacity suggesting subcortical functioning is also doing generally well without indications of any impairments.  Visual and auditory reasoning were generally quite good specifically for visual reasoning with only very mild deficits in verbal reasoning.  No deficits were noted with regard to behavioral or personality changes in general frontal lobe functioning also appeared to be quite good.  Almost all of the deficits noted with the objective assessment, subjective symptoms reported by patient and her daughter as well as focal changes noted on MRI pertaining to temporal lobe functioning.  As far as diagnostic considerations are concerned the patient is described as having progressive changes over the past 5 years and very focal areas related to memory and learning.  The patient is also had some geographic disorientation potentially but otherwise visual-spatial components are doing quite well.  There do not appear to be any significant subcortical changes or abnormalities.  This pattern is not  consistent with frontotemporal dementia and there are no symptoms consistent with Lewy body dementia other than her specific memory deficits.  I do suspect that this is an example of very uncomplicated primarily temporal lobe dominated late onset dementia of the Alzheimer's type.  Given the late onset nature and generally well-preserved frontal and parietal lobe functioning I do suspect that this is likely to be a very slow progressing condition as at this point 5 years after first  noted symptoms there is still very specifically isolated to deficits in storage and organization of information with no particular or significant widespread involvement of parietal or frontal brain regions.  Formal diagnostic consideration primarily resides with a late onset dementia of the Alzheimer's type.  I have a schedule appointment with the patient and her daughter scheduled on 06/20/2020 to sit down and go over the results of this evaluation as well as making particular treatment recommendations and planning going forward.  I do think that this is a very uncomplicated presentation and she may very well respond positively to specific medications addressing acetylcholine functioning and acetylcholinesterase inhibitors.  I will leave particular medication regimens to her neurologist but will address specific practical recommendations with the patient and her daughter.  Diagnosis:    Dementia of the Alzheimer's type with late onset without behavioral disturbance ([place])   _____________________ Ilean Skill, Psy.D. Clinical Neuropsychologist

## 2020-06-27 ENCOUNTER — Encounter: Payer: Self-pay | Admitting: Psychology

## 2020-06-27 DIAGNOSIS — E119 Type 2 diabetes mellitus without complications: Secondary | ICD-10-CM | POA: Diagnosis not present

## 2020-06-27 NOTE — Progress Notes (Signed)
06/20/2020:  Today's visit was an in person visit that was conducted in my outpatient clinic office with the patient and her family present.  We reviewed the results of the recent neuropsychological evaluation that can be found in the patient's EMR and his complete form dated 06/13/2020.  I will include the summary and impressions from that report below for convenience.  The patient also has her full clinical interview information in her EMR dated 05/09/2020 and all of the raw data from the testing can be found in her EMR dated 05/17/2020.  The patient's neuropsychological evaluation is consistent with dementia of the Alzheimer's type with late onset without behavioral disturbance.  This appears to be a relatively uncomplicated presentation but also very strong consistencies between available medical records, subjective reports of objective measures of cognitive functioning including very detailed assessment of her memory and learning capacity.  The patient was able to comprehend and understand this diagnosis and we are able to review specific recommendations going forward.  At this point I do not think we need to set up for further testing as this does appear to be a very consistent presentation of symptoms.  However, if there are significant changes in the patient's status beyond further progression I am available for further assessment.  I am also available for the patient and her family if the need arises as far as further coping and adjustment recommendations.    Summary of Results:                        The results of the current objective neuropsychological evaluation point out a very consistent pattern.  The patient did recently well on measures of expressive language functioning and she did exceptionally well on multiple aspects of cognitive functioning.  This was particularly true for the patient's information processing speed and visual scanning/visual searching abilities, the patient's visual spatial  and visual constructional abilities and visual reasoning abilities as well as normal performance with regard to her overall verbal reasoning, general fund of information and vocabulary skills as well as her auditory encoding and visual encoding components.  There were some very slight relative weaknesses with regard to verbal reasoning components but these were not significant.  This well-preserved an excellent performance on various measures of cognitive performance outside of memory functions was in sharp contrast to significant deficits for both auditory and visual memory components.  The patient did quite well on auditory and visual encoding capacity but showed significant short-term and intermediate memory deficits for both auditory and visual memory capacity.  The patient did not show any significant improvement in function under cued recall formats therefore memory deficits appear to be quite specific with regard to deficits in storage and organization of information and not simply a function of attentional deficits related to encoding impairments or deficits for retrieval of information that was actually effectively stored and organized.  Impression/Diagnosis:                     Overall, the results of the current neuropsychological evaluation are quite consistent and primarily focally consistent.  Subjective reports by the patient's daughter and to a lesser degree the patient herself, medical data and neuro imaging results as well as objective neuropsychological test performance suggest primary and focal deficits having to do with both right and left temporal lobe functioning.  The patient did very well on measures of visual-spatial, visual reasoning and visual analysis capacities as well as overall  information processing speed.  These 2 components place heavy emphasis on both right and left parietal lobe functioning as well as subcortical/white matter functioning.  The patient did very well on both  auditory and visual encoding capacity suggesting subcortical functioning is also doing generally well without indications of any impairments.  Visual and auditory reasoning were generally quite good specifically for visual reasoning with only very mild deficits in verbal reasoning.  No deficits were noted with regard to behavioral or personality changes in general frontal lobe functioning also appeared to be quite good.  Almost all of the deficits noted with the objective assessment, subjective symptoms reported by patient and her daughter as well as focal changes noted on MRI pertaining to temporal lobe functioning.  As far as diagnostic considerations are concerned the patient is described as having progressive changes over the past 5 years and very focal areas related to memory and learning.  The patient is also had some geographic disorientation potentially but otherwise visual-spatial components are doing quite well.  There do not appear to be any significant subcortical changes or abnormalities.  This pattern is not consistent with frontotemporal dementia and there are no symptoms consistent with Lewy body dementia other than her specific memory deficits.  I do suspect that this is an example of very uncomplicated primarily temporal lobe dominated late onset dementia of the Alzheimer's type.  Given the late onset nature and generally well-preserved frontal and parietal lobe functioning I do suspect that this is likely to be a very slow progressing condition as at this point 5 years after first noted symptoms there is still very specifically isolated to deficits in storage and organization of information with no particular or significant widespread involvement of parietal or frontal brain regions.  Formal diagnostic consideration primarily resides with a late onset dementia of the Alzheimer's type.  I have a schedule appointment with the patient and her daughter scheduled on 06/20/2020 to sit down and go  over the results of this evaluation as well as making particular treatment recommendations and planning going forward.  I do think that this is a very uncomplicated presentation and she may very well respond positively to specific medications addressing acetylcholine functioning and acetylcholinesterase inhibitors.  I will leave particular medication regimens to her neurologist but will address specific practical recommendations with the patient and her daughter.  Diagnosis:                               Dementia of the Alzheimer's type with late onset without behavioral disturbance ([place])   _____________________ Ilean Skill, Psy.D. Clinical Neuropsychologist

## 2020-07-22 DIAGNOSIS — E785 Hyperlipidemia, unspecified: Secondary | ICD-10-CM | POA: Diagnosis not present

## 2020-07-22 DIAGNOSIS — E559 Vitamin D deficiency, unspecified: Secondary | ICD-10-CM | POA: Diagnosis not present

## 2020-07-22 DIAGNOSIS — E119 Type 2 diabetes mellitus without complications: Secondary | ICD-10-CM | POA: Diagnosis not present

## 2020-07-26 DIAGNOSIS — K760 Fatty (change of) liver, not elsewhere classified: Secondary | ICD-10-CM | POA: Diagnosis not present

## 2020-07-26 DIAGNOSIS — Z1331 Encounter for screening for depression: Secondary | ICD-10-CM | POA: Diagnosis not present

## 2020-07-26 DIAGNOSIS — Z0000 Encounter for general adult medical examination without abnormal findings: Secondary | ICD-10-CM | POA: Diagnosis not present

## 2020-07-26 DIAGNOSIS — F0390 Unspecified dementia without behavioral disturbance: Secondary | ICD-10-CM | POA: Diagnosis not present

## 2020-07-26 DIAGNOSIS — D692 Other nonthrombocytopenic purpura: Secondary | ICD-10-CM | POA: Diagnosis not present

## 2020-07-26 DIAGNOSIS — Z1389 Encounter for screening for other disorder: Secondary | ICD-10-CM | POA: Diagnosis not present

## 2020-07-26 DIAGNOSIS — R82998 Other abnormal findings in urine: Secondary | ICD-10-CM | POA: Diagnosis not present

## 2020-07-26 DIAGNOSIS — E785 Hyperlipidemia, unspecified: Secondary | ICD-10-CM | POA: Diagnosis not present

## 2020-07-26 DIAGNOSIS — E119 Type 2 diabetes mellitus without complications: Secondary | ICD-10-CM | POA: Diagnosis not present

## 2020-07-26 DIAGNOSIS — M8580 Other specified disorders of bone density and structure, unspecified site: Secondary | ICD-10-CM | POA: Diagnosis not present

## 2020-07-26 DIAGNOSIS — I10 Essential (primary) hypertension: Secondary | ICD-10-CM | POA: Diagnosis not present

## 2020-07-26 DIAGNOSIS — D8989 Other specified disorders involving the immune mechanism, not elsewhere classified: Secondary | ICD-10-CM | POA: Diagnosis not present

## 2020-08-01 ENCOUNTER — Telehealth: Payer: Self-pay | Admitting: Neurology

## 2020-08-01 NOTE — Telephone Encounter (Signed)
Pt's daughter(on DPR) is asking if information was received from Dr Sima Matas from pt's appointment with him on 03-24.  Daughter asking what Dr Jaynee Eagles would want to do at this point, please call.

## 2020-08-05 NOTE — Telephone Encounter (Signed)
Patient diagnosed with Alzheimers. If daughter is ok she can see an NP, at this time we would likely see her to discuss starting medication for alzheimers. thanks

## 2020-08-06 NOTE — Telephone Encounter (Signed)
Diagnosed with Alzheimer's. My thoughts were Aricept and Memantine. When I saw her she was not on Aricept, she is following up tomorrow with you thanks

## 2020-08-06 NOTE — Progress Notes (Addendum)
Chief Complaint  Patient presents with  . Follow-up    Rm 1, w/ daughter Jan, discuss medication for Alzheimer      HISTORY OF PRESENT ILLNESS: 08/07/20 ALL:  Laura Maxwell is a 85 y.o. female here today for follow up for memory loss. She was seen in consult by Dr Jaynee Eagles 01/2020. MRI showed some volume loss and she was referred to neuropsychology. Formal evaluation consistent with Alzheimer's Dementia. She did very well in most ares with exception of auditory and visual memory. Dr Virgina Jock, PCP, started Aricept about 2 weeks ago. She has tolerated it well. She continues to be independent with ADLs. She does drive without difficulty. MMSE 27/30. No falls or changes in gait. She is eating normally. She lives with her husband. She is very active, physically and mentally. She likes to play piano.     HISTORY (copied from Dr Cathren Laine previous note)   HPI:  Laura Maxwell is a 85 y.o. female here as requested by Shon Baton, MD for memory loss.  Type 2 diabetes, vitamin D deficiency, hypertension.  Daughter provides most info and says they first noted she was repeating things, even in the same conversation or sitting. There are some events that she does not remember like from a year ago.etails and dates she forgets, she forgot things at church due to the date being wrong in her head.She missed a senior lunch. With driving she has missed her Rheumatology appointment going to the wrong strees. She has accidentally canceled by hitting 2 and went to behavioral health building. Ongoing for 5 years, slowly progressive and more short term memory loss. Father dies in 66s, mother dies at 35 and had "mini strokes" and she had memory problems. They have started collecting their mail and daughter helps the,m with it but they pay them themselves and nothing known that as been missing or double paid. She uses her debit card eveywhere she does. They will write check to power and others and her husband does that.They  eat out all the time with her husband. Needing assistance with recipes. No accidents n the home or leaving the stove on. Lives with husband who is retired. Home is cluttered and they have a dog. No alcohol. No depression/anxiety.  Symptoms are progressively worsening.  Reviewed notes, labs and imaging from outside physicians, which showed:  I reviewed [place] notes.  Patient presented last with memory loss, reports daughter think she has Alzheimer's, she forgets things but she has done this all her life, friends at church have similar issues, patient reports that her family has concerned about her breakfast casserole they were out at the beach and she does not remember making the casserole, also does not member where she puts keys, patient son gave her some flowers for her BD flowers were put by the back door and patient spouse did not remember who the flowers came from, patient and spouse are responsible paying their bills, sent for evaluation of memory changes.   REVIEW OF SYSTEMS: Out of a complete 14 system review of symptoms, the patient complains only of the following symptoms, memory loss and all other reviewed systems are negative.    ALLERGIES: Allergies  Allergen Reactions  . Ace Inhibitors Swelling and Other (See Comments)    The patient experienced angioedema (tongue became swollen) after taking quinapril.  She states that she had been taking this medicine "for years" without any side effects/adverse reactions.  This may also have been caused  by the Azo she took.   . Phenazopyridine Swelling and Other (See Comments)    The patient started taking this the week before her reaction.  Her last dose was 4 days prior to the angioedema.  This may also have been cause be her ace inhibitor.      HOME MEDICATIONS: Outpatient Medications Prior to Visit  Medication Sig Dispense Refill  . amLODipine (NORVASC) 5 MG tablet Take 5 mg by mouth at bedtime.     Marland Kitchen Apoaequorin  (PREVAGEN) 10 MG CAPS Take 1 capsule by mouth daily.    . Cholecalciferol (VITAMIN D3) 50 MCG (2000 UT) TABS Take by mouth.    . clobetasol cream (TEMOVATE) [id number] % Place 1 application vaginally daily as needed for itching.    . Coenzyme Q10 (CO Q 10) 100 MG CAPS Take 1 capsule by mouth daily.    Marland Kitchen EPINEPHrine 0.3 mg/0.3 mL IJ SOAJ injection Inject 0.3 mg into the muscle as needed for anaphylaxis.    . folic acid (FOLVITE) 1 MG tablet Take 1 mg by mouth daily.    Marland Kitchen JARDIANCE 10 MG TABS tablet Take 10 mg by mouth daily before breakfast.    . labetalol (NORMODYNE) 300 MG tablet Take 300 mg by mouth 2 (two) times daily.     . meloxicam (MOBIC) 15 MG tablet Take 15 mg by mouth daily.    . metFORMIN (GLUCOPHAGE) 500 MG tablet Take 500 mg by mouth daily with breakfast.    . methotrexate (RHEUMATREX) 2.5 MG tablet Take 15 mg by mouth once a week.    . Multiple Vitamins-Minerals (ONE-A-DAY WOMENS 50 PLUS) TABS Take 1 tablet by mouth daily with breakfast.    . Omega-3 Fatty Acids (FISH OIL) 1200 MG CAPS Take 1 capsule by mouth daily.    . rosuvastatin (CRESTOR) 5 MG tablet Take 5 mg by mouth. Daily for 5 days per week.    . spironolactone (ALDACTONE) 25 MG tablet Take 25 mg by mouth daily. 6 days per week. Skips Sunday.    Marland Kitchen VITAMIN E PO Take 1 capsule by mouth 2 (two) times a week.     Facility-Administered Medications Prior to Visit  Medication Dose Route Frequency Provider Last Rate Last Admin  . 0.9 %  sodium chloride infusion  500 mL Intravenous Once Ladene Artist, MD         PAST MEDICAL HISTORY: Past Medical History:  Diagnosis Date  . Allergic    Pt is under care of allergy doctor due to swelling of tongue-unsure of med  . Cataract    bil/had surgery  . Chronic cystitis    per [facility]  . Colon polyp    recurrent; per [facility]  . CTS (carpal tunnel syndrome)    per [facility]  . Diabetes mellitus without complication ([place])    . Diverticulosis   . Fatty liver    per [facility]  . H/O vertigo    per [facility]  . Hyperlipidemia   . Hypertension   . Neckache    per [facility]  . OSA (obstructive sleep apnea)    Mild; per [facility]  . Osteoarthritis    per [facility]  . Osteopenia    per [facility]  . Rheumatoid arthritis ([place])    pt states she has been told she is in remission     PAST SURGICAL HISTORY: Past Surgical History:  Procedure Laterality  Date  . ABDOMINAL SURGERY     benign tumor  . bilateral cataracts  2012   per [facility]  . BUNIONECTOMY     right foot  . DILATION AND CURETTAGE OF UTERUS    . DILATION AND CURETTAGE OF UTERUS    . eyelid retraction      @ [facility]; per [facility]  . I&D of 4 Aptic Lesions (MRSA?)     per [facility]  . ROTATOR CUFF REPAIR     right  . uterine polypectomy     per [facility]     FAMILY HISTORY: Family History  Problem Relation Age of Onset  . Breast cancer Mother   . Bone cancer Mother   . Heart Problems Mother   . Other Mother        "mini strokes"  . Emphysema Father   . Prostate cancer Father   . Colon cancer Sister   . Diabetes Sister   . Heart disease Brother   . Diabetes Brother   . Dementia Neg Hx   . Alzheimer's disease Neg Hx      SOCIAL HISTORY: Social History   Socioeconomic History  . Marital status: Married    Spouse name: Not on file  . Number of children: 2  . Years of education: Not on file  . Highest education level: High school graduate  Occupational History  . Occupation: retired  Tobacco Use  . Smoking status: Never Smoker  . Smokeless tobacco: Never Used  Vaping Use  . Vaping Use: Never used  Substance and Sexual Activity  . Alcohol use: Never  . Drug use: Never  . Sexual activity: Not on file  Other Topics Concern  . Not on file   Social History Narrative   Lives at home with husband   Right handed   Caffeine: sometimes in soft drinks    Social Determinants of Health   Financial Resource Strain: Not on file  Food Insecurity: Not on file  Transportation Needs: Not on file  Physical Activity: Not on file  Stress: Not on file  Social Connections: Not on file  Intimate Partner Violence: Not on file      PHYSICAL EXAM  Vitals:   08/07/20 0832  BP: (!) 166/82  Pulse: 68  Weight: 177 lb (80.3 kg)  Height: 5\' 5"  (1.651 m)   Body mass index is 29.45 kg/m.   Generalized: Well developed, in no acute distress  Cardiology: normal rate and rhythm, no murmur auscultated  Respiratory: clear to auscultation bilaterally    Neurological examination  Mentation: Alert oriented to time, place, history taking. Follows all commands speech and language fluent Cranial nerve II-XII: Pupils were equal round reactive to light. Extraocular movements were full, visual field were full on confrontational test. Facial sensation and strength were normal. Uvula tongue midline. Head turning and shoulder shrug  were normal and symmetric. Motor: The motor testing reveals 5 over 5 strength of all 4 extremities. Good symmetric motor tone is noted throughout.  Sensory: Sensory testing is intact to soft touch on all 4 extremities. No evidence of extinction is noted.  Coordination: Cerebellar testing reveals good finger-nose-finger and heel-to-shin bilaterally.  Gait and station: Gait is normal. Tandem gait is normal. Romberg is negative. No drift is seen.  Reflexes: Deep tendon reflexes are symmetric and normal bilaterally.     DIAGNOSTIC DATA (LABS, IMAGING, TESTING) - I reviewed patient records, labs, notes, testing and imaging myself where  available.  Lab Results  Component Value Date   WBC 10.7 01/29/2020   HGB 15.7 01/29/2020   HCT 46.0 01/29/2020   MCV 94 01/29/2020   PLT 269 01/29/2020      Component Value Date/Time    NA 141 01/29/2020 1232   K 4.6 01/29/2020 1232   CL 104 01/29/2020 1232   CO2 24 01/29/2020 1232   GLUCOSE 212 (H) 01/29/2020 1232   GLUCOSE 230 (H) 09/16/2018 2044   BUN 16 01/29/2020 1232   CREATININE 0.82 01/29/2020 1232   CALCIUM 10.0 01/29/2020 1232   PROT 6.4 01/29/2020 1232   ALBUMIN 4.4 01/29/2020 1232   AST 14 01/29/2020 1232   ALT 21 01/29/2020 1232   ALKPHOS 99 01/29/2020 1232   BILITOT 0.3 01/29/2020 1232   GFRNONAA 67 01/29/2020 1232   GFRAA 77 01/29/2020 1232   No results found for: CHOL, HDL, LDLCALC, LDLDIRECT, TRIG, CHOLHDL No results found for: HGBA1C Lab Results  Component Value Date   RAQTMAUQ33 354 01/29/2020   Lab Results  Component Value Date   TSH 1.840 01/29/2020    MMSE - Mini Mental State Exam 01/29/2020  Orientation to time 5  Orientation to Place 4  Registration 3  Attention/ Calculation 4  Recall 2  Language- name 2 objects 2  Language- repeat 1  Language- follow 3 step command 3  Language- read & follow direction 1  Write a sentence 1  Copy design 1  Total score 27     No flowsheet data found.   ASSESSMENT AND PLAN  85 y.o. year old female  has a past medical history of Allergic, Cataract, Chronic cystitis, Colon polyp, CTS (carpal tunnel syndrome), Diabetes mellitus without complication ([place]), Diverticulosis, Fatty liver, H/O vertigo, Hyperlipidemia, Hypertension, Neckache, OSA (obstructive sleep apnea), Osteoarthritis, Osteopenia, and Rheumatoid arthritis ([place]). here with   Alzheimer's type dementia with late onset without behavioral disturbance ([place])  Ashtan is doing well. Formal memory evaluation was consistent with Alzheimer Dementia. She has tolerated Aricept 5mg  for the past two weeks, started by PCP. She was advised to continue current plan. May consider increasing to 10mg  as directed by PCP. We have reviewed diagnosis. She was encouraged to continue regular physical and mental activity. Memory compensation strategies  reviewed. She will follow up in 6 months to repeat MMSE. She and her daughter, Jan, verbalize understanding and agreement with this plan.   No orders of the defined types were placed in this encounter.    No orders of the defined types were placed in this encounter.    Debbora Presto, MSN, FNP-C 08/07/2020, 8:54 AM  [facility] [address] [place], [place] [address] [phone]  agree with assessment and plan as stated.     Sarina Ill, MD [facility]

## 2020-08-06 NOTE — Telephone Encounter (Signed)
Thank you! She already had formal neurocognitive testing and spent lots of time with Dr. Sima Matas. He is veru thorough, usually patients don;t have many questions after they see him. He has already reviewed her diagnosis and discussed Alzheimer's with the fmaily thanks

## 2020-08-06 NOTE — Telephone Encounter (Signed)
Spoke with Laura Maxwell's daughter, Bethena Midget, and discussed Dr Cathren Laine recommendation to see Laura Maxwell in office to discuss medication for Alzheimer's. Jan scheduled an appt with Amy NP tomorrow at 8 AM. She told me that her sister called her and said the patient told her she was placed on medication for memory by Dr Shelia Media. I asked her to get a list of current medications to review with NP and we can discuss. She verbalized understanding and appreciation. If this appt doesn't work, she will call to reschedule.

## 2020-08-06 NOTE — Telephone Encounter (Signed)
Got it! I will take care of her. TY.

## 2020-08-06 NOTE — Patient Instructions (Signed)
Below is our plan:  We will continue Aricept as recommended by PCP. Follow up closely with Dr Virgina Jock and consider increasing dose to 10mg  daily when he feels this is appropriate.   Please make sure you are staying well hydrated. I recommend 50-60 ounces daily. Well balanced diet and regular exercise encouraged. Consider looking into The MIND Diet. There is some data to suggest this is helpful in healthy brain functioning.  Consistent sleep schedule with 6-8 hours recommended.   Please continue follow up with care team as directed.   Follow up with me in 6 months   You may receive a survey regarding today's visit. I encourage you to leave honest feed back as I do use this information to improve patient care. Thank you for seeing me today!      Management of Memory Problems   There are some general things you can do to help manage your memory problems.  Your memory may not in fact recover, but by using techniques and strategies you will be able to manage your memory difficulties better.   1)  Establish a routine. ? Try to establish and then stick to a regular routine.  By doing this, you will get used to what to expect and you will reduce the need to rely on your memory.  Also, try to do things at the same time of day, such as taking your medication or checking your calendar first thing in the morning. ? Think about think that you can do as a part of a regular routine and make a list.  Then enter them into a daily planner to remind you.  This will help you establish a routine.   2)  Organize your environment. ? Organize your environment so that it is uncluttered.  Decrease visual stimulation.  Place everyday items such as keys or cell phone in the same place every day (ie.  Basket next to front door) ? Use post it notes with a brief message to yourself (ie. Turn off light, lock the door) ? Use labels to indicate where things go (ie. Which cupboards are for food, dishes, etc.) ? Keep a  notepad and pen by the telephone to take messages   3)  Memory Aids ? A diary or journal/notebook/daily planner ? Making a list (shopping list, chore list, to do list that needs to be done) ? Using an alarm as a reminder (kitchen timer or cell phone alarm) ? Using cell phone to store information (Notes, Calendar, Reminders) ? Calendar/White board placed in a prominent position ? Post-it notes   In order for memory aids to be useful, you need to have good habits.  It's no good remembering to make a note in your journal if you don't remember to look in it.  Try setting aside a certain time of day to look in journal.   4)  Improving mood and managing fatigue. 1. There may be other factors that contribute to memory difficulties.  Factors, such as anxiety, depression and tiredness can affect memory.  Regular gentle exercise can help improve your mood and give you more energy.  Simple relaxation techniques may help relieve symptoms of anxiety  Try to get back to completing activities or hobbies you enjoyed doing in the past.  Learn to pace yourself through activities to decrease fatigue.  Find out about some local support groups where you can share experiences with others.  Try and achieve 7-8 hours of sleep at night.   Dementia Dementia  is a condition that affects the way the brain works. It often affects memory and thinking. There are many types of dementia. Some types get worse with time and cannot be reversed. Some types of dementia include:  Alzheimer's disease. This is the most common type.  Vascular dementia. This type may happen due to a stroke.  Lewy body dementia. This type may happen to people who have Parkinson's disease.  Frontotemporal dementia. This type is caused by damage to nerve cells in certain parts of the brain. Some people may have more than one type. What are the causes? This condition is caused by damage to cells in the brain. Some causes that cannot be  reversed include:  Having a condition that affects the blood vessels of the brain, such as diabetes, heart disease, or blood vessel disease.  Changes to genes. Some causes that can be reversed or slowed include:  Injury to the brain.  Certain medicines.  Infection.  Not having enough vitamin B12 in the body, or thyroid problems.  A tumor, blood clot, or too much fluid in the brain.  Certain diseases that cause your body's defense system (immune system) to attack healthy parts of the body. What are the signs or symptoms?  Problems remembering events or people.  Having trouble taking a bath or putting clothes on.  Forgetting appointments.  Forgetting to pay bills.  Trouble planning and making meals.  Having trouble speaking.  Getting lost easily.  Changes in behavior or mood. How is this treated? Treatment depends on the cause of the dementia. It might include:  Taking medicines for symptoms or to help control or slow down the dementia.  Treating the cause of your dementia. Your doctor can help you find support groups and other doctors who can help with your care. Follow these instructions at home: Medicines  Take over-the-counter and prescription medicines only as told by your doctor.  Use a pill organizer to help you manage your medicines.  Avoidtaking medicines for pain or for sleep. Lifestyle  Make healthy choices: ? Be active as told by your doctor. ? Do not smoke or use any products that contain nicotine or tobacco. If you need help quitting, ask your doctor. ? Do not drink alcohol. ? When you get stressed, do something that will help you relax. Your doctor can give you tips. ? Spend time with other people.  Make sure you get good sleep. To get good sleep: ? Try not to take naps during the day. ? Keep your bedroom dark and cool. ? In the few hours before you go to bed, try not to do any exercise. ? Do not have foods and drinks with caffeine at  night. Eating and drinking  Drink enough fluid to keep your pee (urine) pale yellow.  Eat a healthy diet. General instructions  Talk with your doctor to figure out: ? What you need help with. ? What your safety needs are.  Ask your doctor if it is safe for you to drive.  If told, wear a bracelet that tracks where you are or shows that you are a person with memory loss.  Work with your family to make big decisions.  Keep all follow-up visits.   Where to find more information  Alzheimer's Association: CapitalMile.co.nz  National Institute on Aging: DVDEnthusiasts.nl  World Health Organization: RoleLink.com.br Contact a doctor if:  You have any new symptoms.  Your symptoms get worse.  You have problems with swallowing or choking. Get help right  away if:  You feel very sad, or feel that you want to harm yourself.  Your family members are worried for your safety. Get help right away if you feel like you may hurt yourself or others, or have thoughts about taking your own life. Go to your nearest emergency room or:  Call your local emergency services (911 in the [place]  Call the [place] [facility] at (424) 552-0675. This is open 24 hours a day.  Text the Crisis Text Line at (607)441-0930. Summary  Dementia often affects memory and thinking.  Some types of dementia get worse with time and cannot be reversed.  Treatment for this condition depends on the cause.  Talk with your doctor to figure out what you need help with.  Your doctor can help you find support groups and other doctors who can help with your care. This information is not intended to replace advice given to you by your health care provider. Make sure you discuss any questions you have with your health care provider. Document Revised: 07/31/2019 Document Reviewed: 07/31/2019 Elsevier Patient Education  [place].

## 2020-08-07 ENCOUNTER — Other Ambulatory Visit: Payer: Self-pay

## 2020-08-07 ENCOUNTER — Ambulatory Visit: Payer: Medicare PPO | Admitting: Family Medicine

## 2020-08-07 ENCOUNTER — Encounter: Payer: Self-pay | Admitting: Family Medicine

## 2020-08-07 VITALS — BP 166/82 | HR 68 | Ht 65.0 in | Wt 177.0 lb

## 2020-08-07 DIAGNOSIS — F0280 Dementia in other diseases classified elsewhere without behavioral disturbance: Secondary | ICD-10-CM | POA: Diagnosis not present

## 2020-08-07 DIAGNOSIS — G301 Alzheimer's disease with late onset: Secondary | ICD-10-CM | POA: Diagnosis not present

## 2020-08-12 DIAGNOSIS — F0390 Unspecified dementia without behavioral disturbance: Secondary | ICD-10-CM | POA: Diagnosis not present

## 2020-08-12 DIAGNOSIS — I10 Essential (primary) hypertension: Secondary | ICD-10-CM | POA: Diagnosis not present

## 2020-08-12 DIAGNOSIS — M8580 Other specified disorders of bone density and structure, unspecified site: Secondary | ICD-10-CM | POA: Diagnosis not present

## 2020-08-12 DIAGNOSIS — E119 Type 2 diabetes mellitus without complications: Secondary | ICD-10-CM | POA: Diagnosis not present

## 2020-08-12 DIAGNOSIS — Z79899 Other long term (current) drug therapy: Secondary | ICD-10-CM | POA: Diagnosis not present

## 2020-08-12 DIAGNOSIS — E559 Vitamin D deficiency, unspecified: Secondary | ICD-10-CM | POA: Diagnosis not present

## 2020-08-22 DIAGNOSIS — R52 Pain, unspecified: Secondary | ICD-10-CM | POA: Diagnosis not present

## 2020-10-22 DIAGNOSIS — I10 Essential (primary) hypertension: Secondary | ICD-10-CM | POA: Diagnosis not present

## 2020-10-22 DIAGNOSIS — R413 Other amnesia: Secondary | ICD-10-CM | POA: Diagnosis not present

## 2020-10-22 DIAGNOSIS — E119 Type 2 diabetes mellitus without complications: Secondary | ICD-10-CM | POA: Diagnosis not present

## 2020-10-22 DIAGNOSIS — E785 Hyperlipidemia, unspecified: Secondary | ICD-10-CM | POA: Diagnosis not present

## 2020-12-06 DIAGNOSIS — I10 Essential (primary) hypertension: Secondary | ICD-10-CM | POA: Diagnosis not present

## 2020-12-06 DIAGNOSIS — M1990 Unspecified osteoarthritis, unspecified site: Secondary | ICD-10-CM | POA: Diagnosis not present

## 2020-12-06 DIAGNOSIS — M8580 Other specified disorders of bone density and structure, unspecified site: Secondary | ICD-10-CM | POA: Diagnosis not present

## 2020-12-06 DIAGNOSIS — D692 Other nonthrombocytopenic purpura: Secondary | ICD-10-CM | POA: Diagnosis not present

## 2020-12-06 DIAGNOSIS — Z23 Encounter for immunization: Secondary | ICD-10-CM | POA: Diagnosis not present

## 2020-12-06 DIAGNOSIS — E559 Vitamin D deficiency, unspecified: Secondary | ICD-10-CM | POA: Diagnosis not present

## 2020-12-06 DIAGNOSIS — K760 Fatty (change of) liver, not elsewhere classified: Secondary | ICD-10-CM | POA: Diagnosis not present

## 2020-12-06 DIAGNOSIS — E785 Hyperlipidemia, unspecified: Secondary | ICD-10-CM | POA: Diagnosis not present

## 2020-12-06 DIAGNOSIS — E119 Type 2 diabetes mellitus without complications: Secondary | ICD-10-CM | POA: Diagnosis not present

## 2020-12-06 DIAGNOSIS — F0390 Unspecified dementia without behavioral disturbance: Secondary | ICD-10-CM | POA: Diagnosis not present

## 2020-12-12 DIAGNOSIS — Z7984 Long term (current) use of oral hypoglycemic drugs: Secondary | ICD-10-CM | POA: Diagnosis not present

## 2020-12-12 DIAGNOSIS — E119 Type 2 diabetes mellitus without complications: Secondary | ICD-10-CM | POA: Diagnosis not present

## 2020-12-12 DIAGNOSIS — I10 Essential (primary) hypertension: Secondary | ICD-10-CM | POA: Diagnosis not present

## 2020-12-12 DIAGNOSIS — E663 Overweight: Secondary | ICD-10-CM | POA: Diagnosis not present

## 2020-12-12 DIAGNOSIS — E785 Hyperlipidemia, unspecified: Secondary | ICD-10-CM | POA: Diagnosis not present

## 2020-12-12 DIAGNOSIS — F0390 Unspecified dementia without behavioral disturbance: Secondary | ICD-10-CM | POA: Diagnosis not present

## 2020-12-12 DIAGNOSIS — Z6827 Body mass index (BMI) 27.0-27.9, adult: Secondary | ICD-10-CM | POA: Diagnosis not present

## 2020-12-12 DIAGNOSIS — Z803 Family history of malignant neoplasm of breast: Secondary | ICD-10-CM | POA: Diagnosis not present

## 2020-12-12 DIAGNOSIS — M069 Rheumatoid arthritis, unspecified: Secondary | ICD-10-CM | POA: Diagnosis not present

## 2020-12-19 DIAGNOSIS — E663 Overweight: Secondary | ICD-10-CM | POA: Diagnosis not present

## 2020-12-19 DIAGNOSIS — R5382 Chronic fatigue, unspecified: Secondary | ICD-10-CM | POA: Diagnosis not present

## 2020-12-19 DIAGNOSIS — M2550 Pain in unspecified joint: Secondary | ICD-10-CM | POA: Diagnosis not present

## 2020-12-19 DIAGNOSIS — Z79899 Other long term (current) drug therapy: Secondary | ICD-10-CM | POA: Diagnosis not present

## 2020-12-19 DIAGNOSIS — Z6827 Body mass index (BMI) 27.0-27.9, adult: Secondary | ICD-10-CM | POA: Diagnosis not present

## 2020-12-19 DIAGNOSIS — M0609 Rheumatoid arthritis without rheumatoid factor, multiple sites: Secondary | ICD-10-CM | POA: Diagnosis not present

## 2021-02-06 NOTE — Progress Notes (Signed)
Chief Complaint  Patient presents with   Follow-up    RM 10. Last seen 08/07/20. Last MMSE 27/30. Today's: 27/30.  No changes, doing well.     HISTORY OF PRESENT ILLNESS: 02/12/21 ALL:  Laura Maxwell returns for follow up for memory loss. She presents with her daughter, today, who aids in history. She is doing well. No significant changes. She continues Aricept 5mg  daily managed by PCP. She continues to be independent with ADLs. She lives with her husband. She continues to drive. No difficulty with day to day driving but has had trouble remembering routes not often driven. Family monitors her on [employer] when she drives any unusual or unfamiliar routes. No accidents. She remembers to call if she is unsure of route. She is sleeping well. Appetite is good. Mood is good.   08/07/2020 ALL:  Laura Maxwell is a 85 y.o. female here today for follow up for memory loss. She was seen in consult by Dr Jaynee Eagles 01/2020. MRI showed some volume loss and she was referred to neuropsychology. Formal evaluation consistent with Alzheimer's Dementia. She did very well in most ares with exception of auditory and visual memory. Dr Virgina Jock, PCP, started Aricept about 2 weeks ago. She has tolerated it well. She continues to be independent with ADLs. She does drive without difficulty. MMSE 27/30. No falls or changes in gait. She is eating normally. She lives with her husband. She is very active, physically and mentally. She likes to play piano.   HISTORY (copied from Dr Cathren Laine previous note)  HPI:  Laura Maxwell is a 85 y.o. female here as requested by Shon Baton, MD for memory loss.  Type 2 diabetes, vitamin D deficiency, hypertension.  Daughter provides most info and says they first noted she was repeating things, even in the same conversation or sitting. There are some events that she does not remember like from a year ago.etails and dates she forgets, she forgot things at church due to the date being wrong in her head.She  missed a senior lunch. With driving she has missed her Rheumatology appointment going to the wrong strees. She has accidentally canceled by hitting 2 and went to behavioral health building. Ongoing for 5 years, slowly progressive and more short term memory loss. Father dies in 38s, mother dies at 67 and had "mini strokes" and she had memory problems. They have started collecting their mail and daughter helps the,m with it but they pay them themselves and nothing known that as been missing or double paid. She uses her debit card eveywhere she does. They will write check to power and others and her husband does that.They eat out all the time with her husband. Needing assistance with recipes. No accidents n the home or leaving the stove on. Lives with husband who is retired. Home is cluttered and they have a dog. No alcohol. No depression/anxiety.  Symptoms are progressively worsening.   Reviewed notes, labs and imaging from outside physicians, which showed:   I reviewed [place] notes.  Patient presented last with memory loss, reports daughter think she has Alzheimer's, she forgets things but she has done this all her life, friends at church have similar issues, patient reports that her family has concerned about her breakfast casserole they were out at the beach and she does not remember making the casserole, also does not member where she puts keys, patient son gave her some flowers for her BD flowers were put by the back door  and patient spouse did not remember who the flowers came from, patient and spouse are responsible paying their bills, sent for evaluation of memory changes.   REVIEW OF SYSTEMS: Out of a complete 14 system review of symptoms, the patient complains only of the following symptoms, memory loss and all other reviewed systems are negative.    ALLERGIES: Allergies  Allergen Reactions   Ace Inhibitors Swelling and Other (See Comments)    The patient experienced  angioedema (tongue became swollen) after taking quinapril.  She states that she had been taking this medicine "for years" without any side effects/adverse reactions.  This may also have been caused by the Azo she took.    Phenazopyridine Swelling and Other (See Comments)    The patient started taking this the week before her reaction.  Her last dose was 4 days prior to the angioedema.  This may also have been cause be her ace inhibitor.      HOME MEDICATIONS: Outpatient Medications Prior to Visit  Medication Sig Dispense Refill   amLODipine (NORVASC) 5 MG tablet Take 5 mg by mouth at bedtime.      clobetasol cream (TEMOVATE) 0.53 % Place 1 application vaginally daily as needed for itching.     donepezil (ARICEPT) 5 MG tablet Take 5 mg by mouth at bedtime.     EPINEPHrine 0.3 mg/0.3 mL IJ SOAJ injection Inject 0.3 mg into the muscle as needed for anaphylaxis.     folic acid (FOLVITE) 1 MG tablet Take 1 mg by mouth daily.     JARDIANCE 10 MG TABS tablet Take 10 mg by mouth daily before breakfast.     labetalol (NORMODYNE) 300 MG tablet Take 300 mg by mouth 2 (two) times daily.      metFORMIN (GLUCOPHAGE) 500 MG tablet Take 500 mg by mouth daily with breakfast.     methotrexate (RHEUMATREX) 2.5 MG tablet Take 15 mg by mouth once a week.     rosuvastatin (CRESTOR) 5 MG tablet Take 5 mg by mouth. Daily for 5 days per week.     spironolactone (ALDACTONE) 25 MG tablet Take 25 mg by mouth daily. 6 days per week. Skips Sunday.     Apoaequorin (PREVAGEN) 10 MG CAPS Take 1 capsule by mouth daily.     Cholecalciferol (VITAMIN D3) 50 MCG (2000 UT) TABS Take by mouth.     Coenzyme Q10 (CO Q 10) 100 MG CAPS Take 1 capsule by mouth daily.     meloxicam (MOBIC) 15 MG tablet Take 15 mg by mouth daily.     Multiple Vitamins-Minerals (ONE-A-DAY WOMENS 50 PLUS) TABS Take 1 tablet by mouth daily with breakfast.     Omega-3 Fatty Acids (FISH OIL) 1200 MG CAPS Take 1 capsule by mouth daily.     VITAMIN E PO Take  1 capsule by mouth 2 (two) times a week.     Facility-Administered Medications Prior to Visit  Medication Dose Route Frequency Provider Last Rate Last Admin   0.9 %  sodium chloride infusion  500 mL Intravenous Once Ladene Artist, MD         PAST MEDICAL HISTORY: Past Medical History:  Diagnosis Date   Allergic    Pt is under care of allergy doctor due to swelling of tongue-unsure of med   Cataract    bil/had surgery   Chronic cystitis    per [facility]   Colon polyp    recurrent; per [facility]   CTS (carpal  tunnel syndrome)    per [facility]   Diabetes mellitus without complication ([place])    Diverticulosis    Fatty liver    per [facility]   H/O vertigo    per [facility]   Hyperlipidemia    Hypertension    Neckache    per [facility]   OSA (obstructive sleep apnea)    Mild; per [facility]   Osteoarthritis    per [facility]   Osteopenia    per [facility]   Rheumatoid arthritis [facility])    pt states she has been told she is in remission     PAST SURGICAL HISTORY: Past Surgical History:  Procedure Laterality Date   ABDOMINAL SURGERY     benign tumor   bilateral cataracts  2012   per [place]     right foot   DILATION AND CURETTAGE OF UTERUS     DILATION AND CURETTAGE OF UTERUS     eyelid retraction      @ [facility]; per [facility]   I&D of 4 Aptic Lesions (MRSA?)     per [place]     right   uterine polypectomy     per [facility]     FAMILY HISTORY: Family History  Problem Relation Age of Onset   Breast cancer Mother    Bone cancer Mother    Heart Problems Mother    Other Mother        "mini strokes"   Emphysema Father    Prostate cancer Father    Colon cancer Sister    Diabetes Sister     Heart disease Brother    Diabetes Brother    Dementia Neg Hx    Alzheimer's disease Neg Hx      SOCIAL HISTORY: Social History   Socioeconomic History   Marital status: Married    Spouse name: Not on file   Number of children: 2   Years of education: Not on file   Highest education level: High school graduate  Occupational History   Occupation: retired  Tobacco Use   Smoking status: Never   Smokeless tobacco: Never  Scientific laboratory technician Use: Never used  Substance and Sexual Activity   Alcohol use: Never   Drug use: Never   Sexual activity: Not on file  Other Topics Concern   Not on file  Social History Narrative   Lives at home with husband   Right handed   Caffeine: sometimes in soft drinks    Social Determinants of Health   Financial Resource Strain: Not on file  Food Insecurity: Not on file  Transportation Needs: Not on file  Physical Activity: Not on file  Stress: Not on file  Social Connections: Not on file  Intimate Partner Violence: Not on file      PHYSICAL EXAM  Vitals:   02/10/21 1129  BP: 123/76  Pulse: 74  Weight: 164 lb 8 oz (74.6 kg)  Height: 5\' 5"  (1.651 m)    Body mass index is 27.37 kg/m.   Generalized: Well developed, in no acute distress  Cardiology: normal rate and rhythm, no murmur auscultated  Respiratory: clear to auscultation bilaterally    Neurological examination  Mentation: Alert oriented to time, place, history taking. Follows all commands speech and language fluent Cranial nerve II-XII: Pupils were equal round reactive to light. Extraocular movements  were full, visual field were full on confrontational test. Facial sensation and strength were normal. Head turning and shoulder shrug  were normal and symmetric. Motor: The motor testing reveals 5 over 5 strength of all 4 extremities. Good symmetric motor tone is noted throughout.  Gait and station: Gait is normal.    DIAGNOSTIC DATA (LABS, IMAGING, TESTING) - I  reviewed patient records, labs, notes, testing and imaging myself where available.  Lab Results  Component Value Date   WBC 10.7 01/29/2020   HGB 15.7 01/29/2020   HCT 46.0 01/29/2020   MCV 94 01/29/2020   PLT 269 01/29/2020      Component Value Date/Time   NA 141 01/29/2020 1232   K 4.6 01/29/2020 1232   CL 104 01/29/2020 1232   CO2 24 01/29/2020 1232   GLUCOSE 212 (H) 01/29/2020 1232   GLUCOSE 230 (H) 09/16/2018 2044   BUN 16 01/29/2020 1232   CREATININE 0.82 01/29/2020 1232   CALCIUM 10.0 01/29/2020 1232   PROT 6.4 01/29/2020 1232   ALBUMIN 4.4 01/29/2020 1232   AST 14 01/29/2020 1232   ALT 21 01/29/2020 1232   ALKPHOS 99 01/29/2020 1232   BILITOT 0.3 01/29/2020 1232   GFRNONAA 67 01/29/2020 1232   GFRAA 77 01/29/2020 1232   No results found for: CHOL, HDL, LDLCALC, LDLDIRECT, TRIG, CHOLHDL No results found for: HGBA1C Lab Results  Component Value Date   QMVHQION62 952 01/29/2020   Lab Results  Component Value Date   TSH 1.840 01/29/2020    MMSE - Mini Mental State Exam 02/10/2021 01/29/2020  Orientation to time 3 5  Orientation to Place 5 4  Registration 3 3  Attention/ Calculation 5 4  Recall 2 2  Language- name 2 objects 2 2  Language- repeat 1 1  Language- follow 3 step command 3 3  Language- read & follow direction 1 1  Write a sentence 1 1  Copy design 1 1  Total score 27 27     No flowsheet data found.   ASSESSMENT AND PLAN  85 y.o. year old female  has a past medical history of Allergic, Cataract, Chronic cystitis, Colon polyp, CTS (carpal tunnel syndrome), Diabetes mellitus without complication ([place]), Diverticulosis, Fatty liver, H/O vertigo, Hyperlipidemia, Hypertension, Neckache, OSA (obstructive sleep apnea), Osteoarthritis, Osteopenia, and Rheumatoid arthritis ([place]). here with   Alzheimer's type dementia with late onset without behavioral disturbance ([place])  Kyliyah is doing well. Formal memory evaluation was consistent with Alzheimer  Dementia. She has tolerated Aricept 5mg . She could consider increasing to 10mg  daily. May also consider adding Namenda if she wishes. We have reviewed diagnosis. She was encouraged to continue regular physical and mental activity. Memory compensation strategies reviewed. She will follow up in 6 months to repeat MMSE. She and her daughter, Jan, verbalize understanding and agreement with this plan.   No orders of the defined types were placed in this encounter.    No orders of the defined types were placed in this encounter.    Debbora Presto, MSN, FNP-C 02/12/2021, 12:16 PM  [facility] [address], [place], [place] [address] [phone]

## 2021-02-06 NOTE — Patient Instructions (Signed)
Below is our plan:  We will continue current treatment plan. Continue Aricept per PCP direction. Consider Namenda if you wish. You can call me if you wish to add it to your regimen.   Please make sure you are staying well hydrated. I recommend 50-60 ounces daily. Well balanced diet and regular exercise encouraged. Consistent sleep schedule with 6-8 hours recommended.   Please continue follow up with care team as directed.   Follow up with me in 6 months   You may receive a survey regarding today's visit. I encourage you to leave honest feed back as I do use this information to improve patient care. Thank you for seeing me today!   Management of Memory Problems   There are some general things you can do to help manage your memory problems.  Your memory may not in fact recover, but by using techniques and strategies you will be able to manage your memory difficulties better.   1)  Establish a routine. Try to establish and then stick to a regular routine.  By doing this, you will get used to what to expect and you will reduce the need to rely on your memory.  Also, try to do things at the same time of day, such as taking your medication or checking your calendar first thing in the morning. Think about think that you can do as a part of a regular routine and make a list.  Then enter them into a daily planner to remind you.  This will help you establish a routine.   2)  Organize your environment. Organize your environment so that it is uncluttered.  Decrease visual stimulation.  Place everyday items such as keys or cell phone in the same place every day (ie.  Basket next to front door) Use post it notes with a brief message to yourself (ie. Turn off light, lock the door) Use labels to indicate where things go (ie. Which cupboards are for food, dishes, etc.) Keep a notepad and pen by the telephone to take messages   3)  Memory Aids A diary or journal/notebook/daily planner Making a list  (shopping list, chore list, to do list that needs to be done) Using an alarm as a reminder (kitchen timer or cell phone alarm) Using cell phone to store information (Notes, Calendar, Reminders) Calendar/White board placed in a prominent position Post-it notes   In order for memory aids to be useful, you need to have good habits.  It's no good remembering to make a note in your journal if you don't remember to look in it.  Try setting aside a certain time of day to look in journal.   4)  Improving mood and managing fatigue. There may be other factors that contribute to memory difficulties.  Factors, such as anxiety, depression and tiredness can affect memory. Regular gentle exercise can help improve your mood and give you more energy. Simple relaxation techniques may help relieve symptoms of anxiety Try to get back to completing activities or hobbies you enjoyed doing in the past. Learn to pace yourself through activities to decrease fatigue. Find out about some local support groups where you can share experiences with others. Try and achieve 7-8 hours of sleep at night.

## 2021-02-10 ENCOUNTER — Ambulatory Visit: Payer: Medicare PPO | Admitting: Family Medicine

## 2021-02-10 ENCOUNTER — Encounter: Payer: Self-pay | Admitting: Family Medicine

## 2021-02-10 ENCOUNTER — Other Ambulatory Visit: Payer: Self-pay

## 2021-02-10 VITALS — BP 123/76 | HR 74 | Ht 65.0 in | Wt 164.5 lb

## 2021-02-10 DIAGNOSIS — G301 Alzheimer's disease with late onset: Secondary | ICD-10-CM

## 2021-02-10 DIAGNOSIS — F0280 Dementia in other diseases classified elsewhere without behavioral disturbance: Secondary | ICD-10-CM

## 2021-02-12 ENCOUNTER — Telehealth: Payer: Self-pay | Admitting: Family Medicine

## 2021-02-12 NOTE — Telephone Encounter (Signed)
Can you please contact her pharmacy to ensure she is taking donepezil. She reported with her daughter that she was taking donepezil daily. Uncertain of dose, previously 5mg  daily. PCP is managing. I do not see it on her medication list now. TY!

## 2021-02-12 NOTE — Telephone Encounter (Signed)
Called [facility] and she was taking the aricept 5 mg. She had picked up a script last 11/21/2020 for 90 tablet.

## 2021-03-05 DIAGNOSIS — B3731 Acute candidiasis of vulva and vagina: Secondary | ICD-10-CM | POA: Diagnosis not present

## 2021-03-05 DIAGNOSIS — R895 Abnormal microbiological findings in specimens from other organs, systems and tissues: Secondary | ICD-10-CM | POA: Diagnosis not present

## 2021-03-05 DIAGNOSIS — L900 Lichen sclerosus et atrophicus: Secondary | ICD-10-CM | POA: Diagnosis not present

## 2021-03-12 DIAGNOSIS — R895 Abnormal microbiological findings in specimens from other organs, systems and tissues: Secondary | ICD-10-CM | POA: Diagnosis not present

## 2021-03-12 DIAGNOSIS — N760 Acute vaginitis: Secondary | ICD-10-CM | POA: Diagnosis not present

## 2021-03-12 DIAGNOSIS — B3731 Acute candidiasis of vulva and vagina: Secondary | ICD-10-CM | POA: Diagnosis not present

## 2021-03-12 DIAGNOSIS — L900 Lichen sclerosus et atrophicus: Secondary | ICD-10-CM | POA: Diagnosis not present

## 2021-03-12 DIAGNOSIS — N762 Acute vulvitis: Secondary | ICD-10-CM | POA: Diagnosis not present

## 2021-03-21 DIAGNOSIS — N762 Acute vulvitis: Secondary | ICD-10-CM | POA: Diagnosis not present

## 2021-04-03 DIAGNOSIS — M1990 Unspecified osteoarthritis, unspecified site: Secondary | ICD-10-CM | POA: Diagnosis not present

## 2021-04-03 DIAGNOSIS — M069 Rheumatoid arthritis, unspecified: Secondary | ICD-10-CM | POA: Diagnosis not present

## 2021-04-03 DIAGNOSIS — E119 Type 2 diabetes mellitus without complications: Secondary | ICD-10-CM | POA: Diagnosis not present

## 2021-04-03 DIAGNOSIS — I10 Essential (primary) hypertension: Secondary | ICD-10-CM | POA: Diagnosis not present

## 2021-04-03 DIAGNOSIS — E785 Hyperlipidemia, unspecified: Secondary | ICD-10-CM | POA: Diagnosis not present

## 2021-04-03 DIAGNOSIS — E559 Vitamin D deficiency, unspecified: Secondary | ICD-10-CM | POA: Diagnosis not present

## 2021-04-03 DIAGNOSIS — M8580 Other specified disorders of bone density and structure, unspecified site: Secondary | ICD-10-CM | POA: Diagnosis not present

## 2021-04-03 DIAGNOSIS — F0390 Unspecified dementia without behavioral disturbance: Secondary | ICD-10-CM | POA: Diagnosis not present

## 2021-04-03 DIAGNOSIS — D692 Other nonthrombocytopenic purpura: Secondary | ICD-10-CM | POA: Diagnosis not present

## 2021-05-12 DIAGNOSIS — G309 Alzheimer's disease, unspecified: Secondary | ICD-10-CM | POA: Diagnosis not present

## 2021-05-12 DIAGNOSIS — Z7984 Long term (current) use of oral hypoglycemic drugs: Secondary | ICD-10-CM | POA: Diagnosis not present

## 2021-05-12 DIAGNOSIS — E663 Overweight: Secondary | ICD-10-CM | POA: Diagnosis not present

## 2021-05-12 DIAGNOSIS — E785 Hyperlipidemia, unspecified: Secondary | ICD-10-CM | POA: Diagnosis not present

## 2021-05-12 DIAGNOSIS — E119 Type 2 diabetes mellitus without complications: Secondary | ICD-10-CM | POA: Diagnosis not present

## 2021-05-12 DIAGNOSIS — Z803 Family history of malignant neoplasm of breast: Secondary | ICD-10-CM | POA: Diagnosis not present

## 2021-05-12 DIAGNOSIS — Z6828 Body mass index (BMI) 28.0-28.9, adult: Secondary | ICD-10-CM | POA: Diagnosis not present

## 2021-05-12 DIAGNOSIS — F02A0 Dementia in other diseases classified elsewhere, mild, without behavioral disturbance, psychotic disturbance, mood disturbance, and anxiety: Secondary | ICD-10-CM | POA: Diagnosis not present

## 2021-05-12 DIAGNOSIS — I10 Essential (primary) hypertension: Secondary | ICD-10-CM | POA: Diagnosis not present

## 2021-05-17 DIAGNOSIS — R52 Pain, unspecified: Secondary | ICD-10-CM | POA: Diagnosis not present

## 2021-06-18 DIAGNOSIS — Z79899 Other long term (current) drug therapy: Secondary | ICD-10-CM | POA: Diagnosis not present

## 2021-06-18 DIAGNOSIS — E663 Overweight: Secondary | ICD-10-CM | POA: Diagnosis not present

## 2021-06-18 DIAGNOSIS — M0609 Rheumatoid arthritis without rheumatoid factor, multiple sites: Secondary | ICD-10-CM | POA: Diagnosis not present

## 2021-06-18 DIAGNOSIS — Z6827 Body mass index (BMI) 27.0-27.9, adult: Secondary | ICD-10-CM | POA: Diagnosis not present

## 2021-06-18 DIAGNOSIS — R5382 Chronic fatigue, unspecified: Secondary | ICD-10-CM | POA: Diagnosis not present

## 2021-07-02 DIAGNOSIS — B3731 Acute candidiasis of vulva and vagina: Secondary | ICD-10-CM | POA: Diagnosis not present

## 2021-07-29 ENCOUNTER — Encounter (HOSPITAL_BASED_OUTPATIENT_CLINIC_OR_DEPARTMENT_OTHER): Payer: Self-pay

## 2021-07-29 ENCOUNTER — Other Ambulatory Visit: Payer: Self-pay

## 2021-07-29 DIAGNOSIS — G4733 Obstructive sleep apnea (adult) (pediatric): Secondary | ICD-10-CM | POA: Diagnosis present

## 2021-07-29 DIAGNOSIS — E785 Hyperlipidemia, unspecified: Secondary | ICD-10-CM | POA: Diagnosis present

## 2021-07-29 DIAGNOSIS — Z800 Family history of malignant neoplasm of digestive organs: Secondary | ICD-10-CM | POA: Diagnosis not present

## 2021-07-29 DIAGNOSIS — Z79899 Other long term (current) drug therapy: Secondary | ICD-10-CM | POA: Insufficient documentation

## 2021-07-29 DIAGNOSIS — R4182 Altered mental status, unspecified: Secondary | ICD-10-CM | POA: Diagnosis not present

## 2021-07-29 DIAGNOSIS — G3184 Mild cognitive impairment, so stated: Secondary | ICD-10-CM | POA: Diagnosis not present

## 2021-07-29 DIAGNOSIS — R220 Localized swelling, mass and lump, head: Secondary | ICD-10-CM | POA: Insufficient documentation

## 2021-07-29 DIAGNOSIS — Z7984 Long term (current) use of oral hypoglycemic drugs: Secondary | ICD-10-CM | POA: Diagnosis not present

## 2021-07-29 DIAGNOSIS — K760 Fatty (change of) liver, not elsewhere classified: Secondary | ICD-10-CM | POA: Diagnosis present

## 2021-07-29 DIAGNOSIS — E1165 Type 2 diabetes mellitus with hyperglycemia: Secondary | ICD-10-CM | POA: Diagnosis present

## 2021-07-29 DIAGNOSIS — Z743 Need for continuous supervision: Secondary | ICD-10-CM | POA: Diagnosis not present

## 2021-07-29 DIAGNOSIS — K5900 Constipation, unspecified: Secondary | ICD-10-CM | POA: Diagnosis not present

## 2021-07-29 DIAGNOSIS — I10 Essential (primary) hypertension: Secondary | ICD-10-CM | POA: Diagnosis present

## 2021-07-29 DIAGNOSIS — Z9889 Other specified postprocedural states: Secondary | ICD-10-CM | POA: Diagnosis not present

## 2021-07-29 DIAGNOSIS — M25552 Pain in left hip: Secondary | ICD-10-CM | POA: Diagnosis not present

## 2021-07-29 DIAGNOSIS — M25562 Pain in left knee: Secondary | ICD-10-CM | POA: Diagnosis not present

## 2021-07-29 DIAGNOSIS — Z86010 Personal history of colonic polyps: Secondary | ICD-10-CM | POA: Diagnosis not present

## 2021-07-29 DIAGNOSIS — M069 Rheumatoid arthritis, unspecified: Secondary | ICD-10-CM | POA: Diagnosis present

## 2021-07-29 DIAGNOSIS — Z01818 Encounter for other preprocedural examination: Secondary | ICD-10-CM | POA: Diagnosis not present

## 2021-07-29 DIAGNOSIS — S72142A Displaced intertrochanteric fracture of left femur, initial encounter for closed fracture: Secondary | ICD-10-CM | POA: Diagnosis present

## 2021-07-29 DIAGNOSIS — Z888 Allergy status to other drugs, medicaments and biological substances status: Secondary | ICD-10-CM | POA: Diagnosis not present

## 2021-07-29 DIAGNOSIS — W010XXA Fall on same level from slipping, tripping and stumbling without subsequent striking against object, initial encounter: Secondary | ICD-10-CM | POA: Diagnosis present

## 2021-07-29 DIAGNOSIS — N3020 Other chronic cystitis without hematuria: Secondary | ICD-10-CM | POA: Diagnosis present

## 2021-07-29 DIAGNOSIS — T783XXA Angioneurotic edema, initial encounter: Secondary | ICD-10-CM | POA: Diagnosis not present

## 2021-07-29 DIAGNOSIS — D72829 Elevated white blood cell count, unspecified: Secondary | ICD-10-CM | POA: Diagnosis not present

## 2021-07-29 DIAGNOSIS — S72002A Fracture of unspecified part of neck of left femur, initial encounter for closed fracture: Secondary | ICD-10-CM | POA: Diagnosis not present

## 2021-07-29 DIAGNOSIS — W19XXXA Unspecified fall, initial encounter: Secondary | ICD-10-CM | POA: Diagnosis not present

## 2021-07-29 DIAGNOSIS — M8580 Other specified disorders of bone density and structure, unspecified site: Secondary | ICD-10-CM | POA: Diagnosis present

## 2021-07-29 DIAGNOSIS — E119 Type 2 diabetes mellitus without complications: Secondary | ICD-10-CM | POA: Diagnosis not present

## 2021-07-29 DIAGNOSIS — Y92009 Unspecified place in unspecified non-institutional (private) residence as the place of occurrence of the external cause: Secondary | ICD-10-CM | POA: Diagnosis not present

## 2021-07-29 DIAGNOSIS — Z833 Family history of diabetes mellitus: Secondary | ICD-10-CM | POA: Diagnosis not present

## 2021-07-29 DIAGNOSIS — S72002D Fracture of unspecified part of neck of left femur, subsequent encounter for closed fracture with routine healing: Secondary | ICD-10-CM | POA: Diagnosis not present

## 2021-07-29 NOTE — ED Triage Notes (Signed)
Patient here POV from Home. ? ?Endorses Facial Swelling. Endorses Swelling to Left Jaw/Face that began approximately at 1400 today. Swelling has been worsening since it began. ? ?No Oral/Airway Occlusion at this Time. ? ?NAD Noted during Triage. A&Ox4. GCS 15. Ambulatory. ?

## 2021-07-30 ENCOUNTER — Emergency Department (HOSPITAL_BASED_OUTPATIENT_CLINIC_OR_DEPARTMENT_OTHER)
Admission: EM | Admit: 2021-07-30 | Discharge: 2021-07-30 | Disposition: A | Discharge status: Home / Self Care | Payer: Medicare PPO | Source: Home / Self Care | Attending: Emergency Medicine | Admitting: Emergency Medicine

## 2021-07-30 DIAGNOSIS — R220 Localized swelling, mass and lump, head: Secondary | ICD-10-CM

## 2021-07-30 MED ORDER — CLINDAMYCIN HCL 300 MG PO CAPS: 300.0000 mg | ORAL_CAPSULE | Freq: Four times a day (QID) | ORAL | 0 refills | Status: DC

## 2021-07-30 MED ORDER — DIPHENHYDRAMINE HCL 25 MG PO CAPS
25.0000 mg | ORAL_CAPSULE | Freq: Once | ORAL | Status: AC
  Administered 2021-07-30: 25 mg via ORAL
  Filled 2021-07-30: qty 1

## 2021-07-30 MED ORDER — CLINDAMYCIN HCL 150 MG PO CAPS
300.0000 mg | ORAL_CAPSULE | Freq: Once | ORAL | Status: AC
  Administered 2021-07-30: 300 mg via ORAL
  Filled 2021-07-30: qty 2

## 2021-07-30 NOTE — Discharge Instructions (Signed)
Begin taking clindamycin as prescribed. ? ?Take Benadryl, 25 mg 3 times daily for the next 3 days. ? ?Return to the emergency department if you develop worsening swelling, severe pain, difficulty breathing or swallowing, or for other new and concerning symptoms. ?

## 2021-07-30 NOTE — ED Provider Notes (Signed)
?[facility] ?Provider Note ? ? ?CSN: [id number] ?Arrival date & time: 07/29/21  2203 ? ?  ? ?History ? ?Chief Complaint  ?Patient presents with  ? Facial Swelling  ? ? ?Laura Maxwell is a 86 y.o. female. ? ?Patient is an 86 year old female with past medical history of hypertension, hyperlipidemia.  Patient presenting today with complaints of left sided facial swelling.  She describes swelling to the left cheek and left lower lip that started earlier today in the absence of any injury or trauma.  She denies any fevers or chills.  She denies any pain.  She denies any cracked or decayed teeth, but has had an extraction in the past of her first and second left lower molars.  She does have the third molar remaining, however denies discomfort with this. ? ?The history is provided by the patient.  ? ?  ? ?Home Medications ?Prior to Admission medications   ?Medication Sig Start Date End Date Taking? Authorizing Provider  ?amLODipine (NORVASC) 5 MG tablet Take 5 mg by mouth at bedtime.     [provider]  ?clobetasol cream (TEMOVATE) 1.44 % Place 1 application vaginally daily as needed for itching. 04/13/18   [provider]  ?donepezil (ARICEPT) 5 MG tablet Take 5 mg by mouth at bedtime.    [provider]  ?EPINEPHrine 0.3 mg/0.3 mL IJ SOAJ injection Inject 0.3 mg into the muscle as needed for anaphylaxis.    [provider]  ?folic acid (FOLVITE) 1 MG tablet Take 1 mg by mouth daily. 02/07/19   [provider]  ?JARDIANCE 10 MG TABS tablet Take 10 mg by mouth daily before breakfast. 07/23/18   [provider]  ?labetalol (NORMODYNE) 300 MG tablet Take 300 mg by mouth 2 (two) times daily.     [provider]  ?metFORMIN (GLUCOPHAGE) 500 MG tablet Take 500 mg by mouth daily with breakfast. 06/18/18   [provider]  ?methotrexate (RHEUMATREX) 2.5 MG tablet Take 15 mg by mouth once a week. 02/06/19   [provider]   ?rosuvastatin (CRESTOR) 5 MG tablet Take 5 mg by mouth. Daily for 5 days per week.    [provider]  ?spironolactone (ALDACTONE) 25 MG tablet Take 25 mg by mouth daily. 6 days per week. Skips Sunday.    [provider]  ?   ? ?Allergies    ?Ace inhibitors and Phenazopyridine   ? ?Review of Systems   ?Review of Systems  ?All other systems reviewed and are negative. ? ?Physical Exam ?Updated Vital Signs ?BP (!) 200/86   Pulse 72   Temp (!) 97 ?F (36.1 ?C) (Temporal)   Resp 14   Ht '5\' 5"'$  (1.651 m)   Wt 74.6 kg   SpO2 100%   BMI 27.37 kg/m?  ?Physical Exam ?Vitals and nursing note reviewed.  ?Constitutional:   ?   General: She is not in acute distress. ?   Appearance: Normal appearance. She is not ill-appearing.  ?HENT:  ?   Head: Normocephalic and atraumatic.  ?   Mouth/Throat:  ?   Comments: There is some swelling noted to the left face overlying the mandible.  There is also some swelling of the left lateral lower lip.  There is no obvious intraoral abnormality. ?Pulmonary:  ?   Effort: Pulmonary effort is normal.  ?Skin: ?   General: Skin is warm and dry.  ?Neurological:  ?   Mental Status: She is alert and oriented  to person, place, and time.  ? ? ?ED Results / Procedures / Treatments   ?Labs ?(all labs ordered are listed, but only abnormal results are displayed) ?Labs Reviewed - No data to display ? ?EKG ?None ? ?Radiology ?No results found. ? ?Procedures ?Procedures  ? ? ?Medications Ordered in ED ?Medications  ?clindamycin (CLEOCIN) capsule 300 mg (has no administration in time range)  ?diphenhydrAMINE (BENADRYL) capsule 25 mg (has no administration in time range)  ? ? ?ED Course/ Medical Decision Making/ A&P ? ?Patient presenting today with complaints of facial and lip swelling as described in the HPI.  The etiology of this is most likely infectious, but does sound as though she had an angioedema like reaction to ACE inhibitors in the past.  There could also be a component of  angioedema as well.  Patient will be treated with clindamycin and Benadryl.  She is to return as needed if symptoms worsen or change.  She is having no difficulty breathing or swallowing.  There is no stridor or intraoral involvement. ? ?Final Clinical Impression(s) / ED Diagnoses ?Final diagnoses:  ?None  ? ? ?Rx / DC Orders ?ED Discharge Orders   ? ? None  ? ?  ? ? ?  ?Veryl Speak, MD ?07/30/21 0020 ? ?

## 2021-08-01 ENCOUNTER — Inpatient Hospital Stay (HOSPITAL_COMMUNITY)
Admission: EM | Admit: 2021-08-01 | Discharge: 2021-08-07 | DRG: 482 | Disposition: A | Discharge status: Skilled Nursing Facility | Payer: Medicare PPO | Attending: Internal Medicine | Admitting: Internal Medicine

## 2021-08-01 ENCOUNTER — Emergency Department (HOSPITAL_COMMUNITY): Payer: Medicare PPO

## 2021-08-01 ENCOUNTER — Encounter (HOSPITAL_COMMUNITY): Payer: Self-pay

## 2021-08-01 ENCOUNTER — Other Ambulatory Visit: Payer: Self-pay

## 2021-08-01 DIAGNOSIS — E785 Hyperlipidemia, unspecified: Secondary | ICD-10-CM | POA: Diagnosis present

## 2021-08-01 DIAGNOSIS — Z800 Family history of malignant neoplasm of digestive organs: Secondary | ICD-10-CM

## 2021-08-01 DIAGNOSIS — Z833 Family history of diabetes mellitus: Secondary | ICD-10-CM

## 2021-08-01 DIAGNOSIS — W19XXXA Unspecified fall, initial encounter: Secondary | ICD-10-CM

## 2021-08-01 DIAGNOSIS — G4733 Obstructive sleep apnea (adult) (pediatric): Secondary | ICD-10-CM | POA: Diagnosis present

## 2021-08-01 DIAGNOSIS — E119 Type 2 diabetes mellitus without complications: Secondary | ICD-10-CM

## 2021-08-01 DIAGNOSIS — N3020 Other chronic cystitis without hematuria: Secondary | ICD-10-CM | POA: Diagnosis present

## 2021-08-01 DIAGNOSIS — S72142A Displaced intertrochanteric fracture of left femur, initial encounter for closed fracture: Principal | ICD-10-CM

## 2021-08-01 DIAGNOSIS — Z86010 Personal history of colonic polyps: Secondary | ICD-10-CM

## 2021-08-01 DIAGNOSIS — W010XXA Fall on same level from slipping, tripping and stumbling without subsequent striking against object, initial encounter: Secondary | ICD-10-CM | POA: Diagnosis present

## 2021-08-01 DIAGNOSIS — Y92009 Unspecified place in unspecified non-institutional (private) residence as the place of occurrence of the external cause: Secondary | ICD-10-CM

## 2021-08-01 DIAGNOSIS — K760 Fatty (change of) liver, not elsewhere classified: Secondary | ICD-10-CM | POA: Diagnosis present

## 2021-08-01 DIAGNOSIS — M8580 Other specified disorders of bone density and structure, unspecified site: Secondary | ICD-10-CM | POA: Diagnosis present

## 2021-08-01 DIAGNOSIS — E1165 Type 2 diabetes mellitus with hyperglycemia: Secondary | ICD-10-CM | POA: Diagnosis present

## 2021-08-01 DIAGNOSIS — I10 Essential (primary) hypertension: Secondary | ICD-10-CM | POA: Diagnosis present

## 2021-08-01 DIAGNOSIS — Z888 Allergy status to other drugs, medicaments and biological substances status: Secondary | ICD-10-CM

## 2021-08-01 DIAGNOSIS — Z7984 Long term (current) use of oral hypoglycemic drugs: Secondary | ICD-10-CM

## 2021-08-01 DIAGNOSIS — D72829 Elevated white blood cell count, unspecified: Secondary | ICD-10-CM | POA: Diagnosis present

## 2021-08-01 DIAGNOSIS — S72002A Fracture of unspecified part of neck of left femur, initial encounter for closed fracture: Secondary | ICD-10-CM | POA: Diagnosis present

## 2021-08-01 DIAGNOSIS — Z79899 Other long term (current) drug therapy: Secondary | ICD-10-CM

## 2021-08-01 DIAGNOSIS — M25562 Pain in left knee: Secondary | ICD-10-CM | POA: Diagnosis present

## 2021-08-01 DIAGNOSIS — M069 Rheumatoid arthritis, unspecified: Secondary | ICD-10-CM | POA: Diagnosis present

## 2021-08-01 MED ORDER — MORPHINE SULFATE (PF) 4 MG/ML IV SOLN
4.0000 mg | INTRAVENOUS | Status: DC | PRN
  Administered 2021-08-01: 4 mg via INTRAVENOUS
  Filled 2021-08-01: qty 1

## 2021-08-01 NOTE — ED Notes (Signed)
Patient transported to X-ray 

## 2021-08-01 NOTE — ED Provider Notes (Signed)
?[facility] ?Provider Note ? ? ?CSN: [id number] ?Arrival date & time: 08/01/21  2153 ? ?  ? ?History ? ?Chief Complaint  ?Patient presents with  ? Hip Pain  ?  Patient arrives from home via EMS. Patient had mechanical fall and tripped over dust pan in kitchen and fell onto left hip. EMS stated that patient's leg is shortened. BP 186/80, HR 69, 97% on room air. BG 242.   ? ? ?Laura Maxwell is a 86 y.o. female. ? ?The history is provided by the patient.  ?Hip Pain ?She has history of hypertension, hyperlipidemia, osteopenia and comes in having injured her left hip.  She tripped over a dust mop and fell injuring her left hip.  She denies other injury.  She denies head injury or loss of consciousness.  She was not able to ambulate and was brought in by ambulance. ?  ?Home Medications ?Prior to Admission medications   ?Medication Sig Start Date End Date Taking? Authorizing Provider  ?amLODipine (NORVASC) 5 MG tablet Take 5 mg by mouth at bedtime.     [provider]  ?clindamycin (CLEOCIN) 300 MG capsule Take 1 capsule (300 mg total) by mouth 4 (four) times daily. X 7 days 07/30/21   Veryl Speak, MD  ?clobetasol cream (TEMOVATE) 4.09 % Place 1 application vaginally daily as needed for itching. 04/13/18   [provider]  ?donepezil (ARICEPT) 5 MG tablet Take 5 mg by mouth at bedtime.    [provider]  ?EPINEPHrine 0.3 mg/0.3 mL IJ SOAJ injection Inject 0.3 mg into the muscle as needed for anaphylaxis.    [provider]  ?folic acid (FOLVITE) 1 MG tablet Take 1 mg by mouth daily. 02/07/19   [provider]  ?JARDIANCE 10 MG TABS tablet Take 10 mg by mouth daily before breakfast. 07/23/18   [provider]  ?labetalol (NORMODYNE) 300 MG tablet Take 300 mg by mouth 2 (two) times daily.     [provider]  ?metFORMIN (GLUCOPHAGE) 500 MG tablet Take 500 mg by mouth daily with breakfast. 06/18/18   [provider]   ?methotrexate (RHEUMATREX) 2.5 MG tablet Take 15 mg by mouth once a week. 02/06/19   [provider]  ?rosuvastatin (CRESTOR) 5 MG tablet Take 5 mg by mouth. Daily for 5 days per week.    [provider]  ?spironolactone (ALDACTONE) 25 MG tablet Take 25 mg by mouth daily. 6 days per week. Skips Sunday.    [provider]  ?   ? ?Allergies    ?Ace inhibitors and Phenazopyridine   ? ?Review of Systems   ?Review of Systems  ?All other systems reviewed and are negative. ? ?Physical Exam ?Updated Vital Signs ?BP (!) 155/87   Pulse 76   Temp 97.8 ?F (36.6 ?C) (Oral)   Resp 13   Ht '5\' 5"'$  (1.651 m)   Wt 75 kg   SpO2 97%   BMI 27.51 kg/m?  ?Physical Exam ?Vitals and nursing note reviewed.  ?86 year old female, resting comfortably and in no acute distress. Vital signs are significant for elevated blood pressure. Oxygen saturation is 97%, which is normal. ?Head is normocephalic and atraumatic. PERRLA, EOMI. Oropharynx is clear. ?Neck is nontender and supple without adenopathy or JVD. ?Back is nontender and there is no CVA tenderness. ?Lungs are clear without rales, wheezes, or rhonchi. ?Chest is nontender. ?Heart has regular rate and rhythm without murmur. ?Abdomen is soft, flat, nontender. ?Extremities:  Left leg is shortened and externally rotated.  There is tenderness to palpation over the left hip anteriorly and pain with internal and external rotation.  Distal neurovascular exam is intact with strong dorsalis pedis pulse, normal sensation, prompt capillary refill.  No other extremity injuries seen. ?Skin is warm and dry without rash. ?Neurologic: Mental status is normal, cranial nerves are intact, moves all extremities equally except for left leg. ? ?ED Results / Procedures / Treatments   ?Labs ?(all labs ordered are listed, but only abnormal results are displayed) ?Labs Reviewed  ?BASIC METABOLIC PANEL - Abnormal; Notable for the following components:  ?    Result Value  ? Glucose, Bld  297 (*)   ? All other components within normal limits  ?CBC WITH DIFFERENTIAL/PLATELET - Abnormal; Notable for the following components:  ? WBC 13.8 (*)   ? Hemoglobin 15.4 (*)   ? Neutro Abs 11.2 (*)   ? Monocytes Absolute 1.1 (*)   ? Abs Immature Granulocytes 0.10 (*)   ? All other components within normal limits  ?COMPREHENSIVE METABOLIC PANEL - Abnormal; Notable for the following components:  ? Glucose, Bld 235 (*)   ? Total Protein 6.0 (*)   ? AST 12 (*)   ? All other components within normal limits  ?CBC WITH DIFFERENTIAL/PLATELET - Abnormal; Notable for the following components:  ? WBC 11.7 (*)   ? Neutro Abs 8.8 (*)   ? Monocytes Absolute 1.6 (*)   ? Abs Immature Granulocytes 0.08 (*)   ? All other components within normal limits  ?HEMOGLOBIN A1C - Abnormal; Notable for the following components:  ? Hgb A1c MFr Bld 8.8 (*)   ? All other components within normal limits  ?URINALYSIS, COMPLETE (UACMP) WITH MICROSCOPIC - Abnormal; Notable for the following components:  ? Glucose, UA >=500 (*)   ? Hgb urine dipstick SMALL (*)   ? Ketones, ur 20 (*)   ? Nitrite POSITIVE (*)   ? Bacteria, UA RARE (*)   ? All other components within normal limits  ?VITAMIN D 25 HYDROXY (VIT D DEFICIENCY, FRACTURES) - Abnormal; Notable for the following components:  ? Vit D, 25-Hydroxy 18.26 (*)   ? All other components within normal limits  ?GLUCOSE, CAPILLARY - Abnormal; Notable for the following components:  ? Glucose-Capillary 114 (*)   ? All other components within normal limits  ?GLUCOSE, CAPILLARY - Abnormal; Notable for the following components:  ? Glucose-Capillary 221 (*)   ? All other components within normal limits  ?GLUCOSE, CAPILLARY - Abnormal; Notable for the following components:  ? Glucose-Capillary 190 (*)   ? All other components within normal limits  ?CBG MONITORING, ED - Abnormal; Notable for the following components:  ? Glucose-Capillary 215 (*)   ? All other components within normal limits  ?URINE CULTURE   ?PROTIME-INR  ?MAGNESIUM  ?TYPE AND SCREEN  ?ABO/RH  ? ? ?EKG ?EKG Interpretation ? ?Date/Time:  Friday Aug 01 2021 23:28:40 EDT ?Ventricular Rate:  66 ?PR Interval:  192 ?QRS Duration: 82 ?QT Interval:  422 ?QTC Calculation: 443 ?R Axis:   10 ?Text Interpretation: Sinus rhythm Low voltage, precordial leads No significant change since last tracing Confirmed by Blanchie Dessert [phone]) on 08/02/2021 3:13:38 PM ? ?Radiology ?DG Hip Unilat W or Wo Pelvis 2-3 Views Left ? ?Result Date: 08/01/2021 ?CLINICAL DATA:  Left hip pain. EXAM: DG HIP (WITH OR WITHOUT PELVIS) 2-3V LEFT FINDINGS: There is an acute left femoral intratrochanteric fracture. Fracture fragments are distracted 1 cm.  There is no dislocation. The bones are mildly osteopenic. Soft tissues are within normal limits. IMPRESSION: Acute left femoral intratrochanteric fracture. Electronically Signed   By: Ronney Asters M.D.   On: 08/01/2021 23:03   ? ?Procedures ?Procedures  ? ? ?Medications Ordered in ED ?Medications  ?morphine (PF) 4 MG/ML injection 4 mg (has no administration in time range)  ? ? ?ED Course/ Medical Decision Making/ A&P ?  ?                        ?Medical Decision Making ?Amount and/or Complexity of Data Reviewed ?Labs: ordered. ?Radiology: ordered. ? ?Risk ?Prescription drug management. ?Decision regarding hospitalization. ? ? ?Fall at home with injury to the left hip, exam strongly suggestive of hip fracture.  X-rays are obtained showing intertrochanteric fracture of the left hip.  I have independently viewed the images, and agree with radiologist's interpretation.  Case is discussed with Dr. Percell Miller, on-call for orthopedics, who agrees to see the patient in consultation. ? ?Labs are unremarkable, ECG shows no acute changes. Case isdiscussed with Dr. Velia Meyer of [facility], who agrees to admit the patient. ? ?Final Clinical Impression(s) / ED Diagnoses ?Final diagnoses:  ?Closed intertrochanteric fracture of left hip, initial encounter  ([place])  ? ? ?Rx / DC Orders ?ED Discharge Orders   ? ? None  ? ?  ? ? ?  ?Delora Fuel, MD ?74/94/49 2246 ? ?

## 2021-08-02 ENCOUNTER — Other Ambulatory Visit: Payer: Self-pay

## 2021-08-02 ENCOUNTER — Encounter (HOSPITAL_COMMUNITY)
Admission: EM | Disposition: A | Discharge status: Skilled Nursing Facility | Payer: Self-pay | Source: Home / Self Care | Attending: Internal Medicine

## 2021-08-02 ENCOUNTER — Encounter: Payer: Self-pay | Admitting: Family Medicine

## 2021-08-02 ENCOUNTER — Encounter (HOSPITAL_COMMUNITY): Payer: Self-pay | Admitting: Internal Medicine

## 2021-08-02 ENCOUNTER — Inpatient Hospital Stay (HOSPITAL_COMMUNITY): Payer: Medicare PPO

## 2021-08-02 ENCOUNTER — Inpatient Hospital Stay (HOSPITAL_COMMUNITY): Payer: Medicare PPO | Admitting: Certified Registered"

## 2021-08-02 DIAGNOSIS — E785 Hyperlipidemia, unspecified: Secondary | ICD-10-CM | POA: Diagnosis present

## 2021-08-02 DIAGNOSIS — N3020 Other chronic cystitis without hematuria: Secondary | ICD-10-CM | POA: Diagnosis present

## 2021-08-02 DIAGNOSIS — Z800 Family history of malignant neoplasm of digestive organs: Secondary | ICD-10-CM | POA: Diagnosis not present

## 2021-08-02 DIAGNOSIS — Z888 Allergy status to other drugs, medicaments and biological substances status: Secondary | ICD-10-CM | POA: Diagnosis not present

## 2021-08-02 DIAGNOSIS — Z7984 Long term (current) use of oral hypoglycemic drugs: Secondary | ICD-10-CM | POA: Diagnosis not present

## 2021-08-02 DIAGNOSIS — S72002A Fracture of unspecified part of neck of left femur, initial encounter for closed fracture: Secondary | ICD-10-CM

## 2021-08-02 DIAGNOSIS — M069 Rheumatoid arthritis, unspecified: Secondary | ICD-10-CM | POA: Diagnosis present

## 2021-08-02 DIAGNOSIS — K760 Fatty (change of) liver, not elsewhere classified: Secondary | ICD-10-CM | POA: Diagnosis present

## 2021-08-02 DIAGNOSIS — I10 Essential (primary) hypertension: Secondary | ICD-10-CM

## 2021-08-02 DIAGNOSIS — G4733 Obstructive sleep apnea (adult) (pediatric): Secondary | ICD-10-CM | POA: Diagnosis present

## 2021-08-02 DIAGNOSIS — E119 Type 2 diabetes mellitus without complications: Secondary | ICD-10-CM

## 2021-08-02 DIAGNOSIS — W010XXA Fall on same level from slipping, tripping and stumbling without subsequent striking against object, initial encounter: Secondary | ICD-10-CM | POA: Diagnosis present

## 2021-08-02 DIAGNOSIS — W19XXXA Unspecified fall, initial encounter: Secondary | ICD-10-CM

## 2021-08-02 DIAGNOSIS — E1165 Type 2 diabetes mellitus with hyperglycemia: Secondary | ICD-10-CM | POA: Diagnosis present

## 2021-08-02 DIAGNOSIS — M8580 Other specified disorders of bone density and structure, unspecified site: Secondary | ICD-10-CM | POA: Diagnosis present

## 2021-08-02 DIAGNOSIS — Z833 Family history of diabetes mellitus: Secondary | ICD-10-CM | POA: Diagnosis not present

## 2021-08-02 DIAGNOSIS — S72002D Fracture of unspecified part of neck of left femur, subsequent encounter for closed fracture with routine healing: Secondary | ICD-10-CM | POA: Diagnosis not present

## 2021-08-02 DIAGNOSIS — Z79899 Other long term (current) drug therapy: Secondary | ICD-10-CM | POA: Diagnosis not present

## 2021-08-02 DIAGNOSIS — D72829 Elevated white blood cell count, unspecified: Secondary | ICD-10-CM

## 2021-08-02 DIAGNOSIS — S72142A Displaced intertrochanteric fracture of left femur, initial encounter for closed fracture: Secondary | ICD-10-CM | POA: Diagnosis present

## 2021-08-02 DIAGNOSIS — Z86010 Personal history of colonic polyps: Secondary | ICD-10-CM | POA: Diagnosis not present

## 2021-08-02 DIAGNOSIS — Y92009 Unspecified place in unspecified non-institutional (private) residence as the place of occurrence of the external cause: Secondary | ICD-10-CM

## 2021-08-02 HISTORY — PX: INTRAMEDULLARY (IM) NAIL INTERTROCHANTERIC: SHX5875

## 2021-08-02 LAB — URINALYSIS, COMPLETE (UACMP) WITH MICROSCOPIC
Bilirubin Urine: NEGATIVE
Glucose, UA: >=500 mg/dL — AB
Ketones, ur: 20 mg/dL — AB
Leukocytes,Ua: NEGATIVE
Nitrite: POSITIVE — AB
Protein, ur: NEGATIVE mg/dL
Specific Gravity, Urine: 1.03 (ref 1.005–1.030)
pH: 5 (ref 5.0–8.0)

## 2021-08-02 LAB — CBC WITH DIFFERENTIAL/PLATELET
Abs Immature Granulocytes: 0.08 10*3/uL — ABNORMAL HIGH (ref 0.00–0.07)
Abs Immature Granulocytes: 0.1 10*3/uL — ABNORMAL HIGH (ref 0.00–0.07)
Basophils Absolute: 0.1 10*3/uL (ref 0.0–0.1)
Basophils Absolute: 0.1 10*3/uL (ref 0.0–0.1)
Basophils Relative: 1 %
Basophils Relative: 1 %
Eosinophils Absolute: 0.1 10*3/uL (ref 0.0–0.5)
Eosinophils Absolute: 0.1 10*3/uL (ref 0.0–0.5)
Eosinophils Relative: 1 %
Eosinophils Relative: 1 %
HCT: 44.6 % (ref 36.0–46.0)
HCT: 45.7 % (ref 36.0–46.0)
Hemoglobin: 15 g/dL (ref 12.0–15.0)
Hemoglobin: 15.4 g/dL — ABNORMAL HIGH (ref 12.0–15.0)
Immature Granulocytes: 1 %
Immature Granulocytes: 1 %
Lymphocytes Relative: 10 %
Lymphocytes Relative: 9 %
Lymphs Abs: 1.1 10*3/uL (ref 0.7–4.0)
Lymphs Abs: 1.2 10*3/uL (ref 0.7–4.0)
MCH: 31.8 pg (ref 26.0–34.0)
MCH: 31.9 pg (ref 26.0–34.0)
MCHC: 33.6 g/dL (ref 30.0–36.0)
MCHC: 33.7 g/dL (ref 30.0–36.0)
MCV: 94.4 fL (ref 80.0–100.0)
MCV: 94.9 fL (ref 80.0–100.0)
Monocytes Absolute: 1.1 10*3/uL — ABNORMAL HIGH (ref 0.1–1.0)
Monocytes Absolute: 1.6 10*3/uL — ABNORMAL HIGH (ref 0.1–1.0)
Monocytes Relative: 14 %
Monocytes Relative: 8 %
Neutro Abs: 11.2 10*3/uL — ABNORMAL HIGH (ref 1.7–7.7)
Neutro Abs: 8.8 10*3/uL — ABNORMAL HIGH (ref 1.7–7.7)
Neutrophils Relative %: 73 %
Neutrophils Relative %: 80 %
Platelets: 215 10*3/uL (ref 150–400)
Platelets: 254 10*3/uL (ref 150–400)
RBC: 4.7 MIL/uL (ref 3.87–5.11)
RBC: 4.84 MIL/uL (ref 3.87–5.11)
RDW: 12.4 % (ref 11.5–15.5)
RDW: 12.5 % (ref 11.5–15.5)
WBC: 11.7 10*3/uL — ABNORMAL HIGH (ref 4.0–10.5)
WBC: 13.8 10*3/uL — ABNORMAL HIGH (ref 4.0–10.5)
nRBC: 0 % (ref 0.0–0.2)
nRBC: 0 % (ref 0.0–0.2)

## 2021-08-02 LAB — COMPREHENSIVE METABOLIC PANEL
ALT: 15 U/L (ref 0–44)
AST: 12 U/L — ABNORMAL LOW (ref 15–41)
Albumin: 3.7 g/dL (ref 3.5–5.0)
Alkaline Phosphatase: 83 U/L (ref 38–126)
Anion gap: 10 (ref 5–15)
BUN: 13 mg/dL (ref 8–23)
CO2: 22 mmol/L (ref 22–32)
Calcium: 9.5 mg/dL (ref 8.9–10.3)
Chloride: 107 mmol/L (ref 98–111)
Creatinine, Ser: 0.75 mg/dL (ref 0.44–1.00)
GFR, Estimated: >60 mL/min (ref >60)
Glucose, Bld: 235 mg/dL — ABNORMAL HIGH (ref 70–99)
Potassium: 3.7 mmol/L (ref 3.5–5.1)
Sodium: 139 mmol/L (ref 135–145)
Total Bilirubin: 1 mg/dL (ref 0.3–1.2)
Total Protein: 6 g/dL — ABNORMAL LOW (ref 6.5–8.1)

## 2021-08-02 LAB — MAGNESIUM: Magnesium: 2.2 mg/dL (ref 1.7–2.4)

## 2021-08-02 LAB — HEMOGLOBIN A1C
Hgb A1c MFr Bld: 8.8 % — ABNORMAL HIGH (ref 4.8–5.6)
Mean Plasma Glucose: 205.86 mg/dL

## 2021-08-02 LAB — PROTIME-INR
INR: 0.9 (ref 0.8–1.2)
Prothrombin Time: 11.9 seconds (ref 11.4–15.2)

## 2021-08-02 LAB — BASIC METABOLIC PANEL
Anion gap: 10 (ref 5–15)
BUN: 15 mg/dL (ref 8–23)
CO2: 23 mmol/L (ref 22–32)
Calcium: 9.7 mg/dL (ref 8.9–10.3)
Chloride: 106 mmol/L (ref 98–111)
Creatinine, Ser: 0.9 mg/dL (ref 0.44–1.00)
GFR, Estimated: >60 mL/min (ref >60)
Glucose, Bld: 297 mg/dL — ABNORMAL HIGH (ref 70–99)
Potassium: 3.8 mmol/L (ref 3.5–5.1)
Sodium: 139 mmol/L (ref 135–145)

## 2021-08-02 LAB — ABO/RH: ABO/RH(D): A POS

## 2021-08-02 LAB — GLUCOSE, CAPILLARY
Glucose-Capillary: 114 mg/dL — ABNORMAL HIGH (ref 70–99)
Glucose-Capillary: 221 mg/dL — ABNORMAL HIGH (ref 70–99)
Glucose-Capillary: 190 mg/dL — ABNORMAL HIGH (ref 70–99)

## 2021-08-02 LAB — TYPE AND SCREEN
ABO/RH(D): A POS
Antibody Screen: NEGATIVE

## 2021-08-02 LAB — CBG MONITORING, ED: Glucose-Capillary: 215 mg/dL — ABNORMAL HIGH (ref 70–99)

## 2021-08-02 LAB — VITAMIN D 25 HYDROXY (VIT D DEFICIENCY, FRACTURES): Vit D, 25-Hydroxy: 18.26 ng/mL — ABNORMAL LOW (ref 30–100)

## 2021-08-02 SURGERY — FIXATION, FRACTURE, INTERTROCHANTERIC, WITH INTRAMEDULLARY ROD
Anesthesia: General | Site: Hip | Laterality: Left

## 2021-08-02 MED ORDER — ACETAMINOPHEN 650 MG RE SUPP: 650.0000 mg | Freq: Four times a day (QID) | RECTAL | Status: DC | PRN

## 2021-08-02 MED ORDER — LIDOCAINE 2% (20 MG/ML) 5 ML SYRINGE
INTRAMUSCULAR | Status: AC
  Filled 2021-08-02: qty 5

## 2021-08-02 MED ORDER — HYDROCODONE-ACETAMINOPHEN 7.5-325 MG PO TABS
1.0000 | ORAL_TABLET | ORAL | Status: DC | PRN
  Administered 2021-08-03 – 2021-08-06 (×3): 1 via ORAL
  Filled 2021-08-02 (×3): qty 1

## 2021-08-02 MED ORDER — CHLORHEXIDINE GLUCONATE 4 % EX LIQD: 60.0000 mL | Freq: Once | CUTANEOUS | Status: DC

## 2021-08-02 MED ORDER — CEFAZOLIN SODIUM 1 G IJ SOLR
INTRAMUSCULAR | Status: AC
  Filled 2021-08-02: qty 20

## 2021-08-02 MED ORDER — CEFTRIAXONE SODIUM 1 G IJ SOLR
1.0000 g | INTRAMUSCULAR | Status: DC
  Administered 2021-08-03 – 2021-08-04 (×2): 1 g via INTRAVENOUS
  Filled 2021-08-02 (×3): qty 10

## 2021-08-02 MED ORDER — ACETAMINOPHEN 325 MG PO TABS: 650.0000 mg | ORAL_TABLET | Freq: Four times a day (QID) | ORAL | Status: DC | PRN

## 2021-08-02 MED ORDER — PHENOL 1.4 % MT LIQD: 1.0000 | OROMUCOSAL | Status: DC | PRN

## 2021-08-02 MED ORDER — LACTATED RINGERS IV SOLN: INTRAVENOUS | Status: DC

## 2021-08-02 MED ORDER — CEFAZOLIN SODIUM-DEXTROSE 2-4 GM/100ML-% IV SOLN
2.0000 g | INTRAVENOUS | Status: AC
  Administered 2021-08-02: 2 g via INTRAVENOUS

## 2021-08-02 MED ORDER — TRANEXAMIC ACID-NACL 1000-0.7 MG/100ML-% IV SOLN
INTRAVENOUS | Status: AC
  Filled 2021-08-02: qty 100

## 2021-08-02 MED ORDER — ROCURONIUM BROMIDE 10 MG/ML (PF) SYRINGE
PREFILLED_SYRINGE | INTRAVENOUS | Status: AC
  Filled 2021-08-02: qty 10

## 2021-08-02 MED ORDER — ASPIRIN EC 81 MG PO TBEC
81.0000 mg | DELAYED_RELEASE_TABLET | Freq: Two times a day (BID) | ORAL | Status: DC
  Administered 2021-08-03 – 2021-08-07 (×9): 81 mg via ORAL
  Filled 2021-08-02 (×9): qty 1

## 2021-08-02 MED ORDER — FLEET ENEMA 7-19 GM/118ML RE ENEM: 1.0000 | ENEMA | Freq: Once | RECTAL | Status: DC | PRN

## 2021-08-02 MED ORDER — CHLORHEXIDINE GLUCONATE 0.12 % MT SOLN
OROMUCOSAL | Status: AC
  Administered 2021-08-02: 15 mL via OROMUCOSAL
  Filled 2021-08-02: qty 15

## 2021-08-02 MED ORDER — POVIDONE-IODINE 10 % EX SWAB: 2.0000 "application " | Freq: Once | CUTANEOUS | Status: DC

## 2021-08-02 MED ORDER — ACETAMINOPHEN 500 MG PO TABS: 1000.0000 mg | ORAL_TABLET | Freq: Once | ORAL | Status: DC

## 2021-08-02 MED ORDER — TRANEXAMIC ACID-NACL 1000-0.7 MG/100ML-% IV SOLN
1000.0000 mg | INTRAVENOUS | Status: AC
  Administered 2021-08-02: 1000 mg via INTRAVENOUS

## 2021-08-02 MED ORDER — PROPOFOL 10 MG/ML IV BOLUS
INTRAVENOUS | Status: AC
Start: 2021-08-02 — End: ?
  Filled 2021-08-02: qty 20

## 2021-08-02 MED ORDER — ALUM & MAG HYDROXIDE-SIMETH 200-200-20 MG/5ML PO SUSP: 30.0000 mL | ORAL | Status: DC | PRN

## 2021-08-02 MED ORDER — BISACODYL 10 MG RE SUPP: 10.0000 mg | Freq: Every day | RECTAL | Status: DC | PRN

## 2021-08-02 MED ORDER — NALOXONE HCL 0.4 MG/ML IJ SOLN: 0.4000 mg | INTRAMUSCULAR | Status: DC | PRN

## 2021-08-02 MED ORDER — TRANEXAMIC ACID-NACL 1000-0.7 MG/100ML-% IV SOLN
1000.0000 mg | Freq: Once | INTRAVENOUS | Status: AC
  Administered 2021-08-02: 1000 mg via INTRAVENOUS
  Filled 2021-08-02: qty 100

## 2021-08-02 MED ORDER — FENTANYL CITRATE (PF) 100 MCG/2ML IJ SOLN: 25.0000 ug | INTRAMUSCULAR | Status: DC | PRN

## 2021-08-02 MED ORDER — PROPOFOL 10 MG/ML IV BOLUS
INTRAVENOUS | Status: DC | PRN
Start: 2021-08-02 — End: 2021-08-02
  Administered 2021-08-02: 120 mg via INTRAVENOUS

## 2021-08-02 MED ORDER — ACETAMINOPHEN 500 MG PO TABS
500.0000 mg | ORAL_TABLET | Freq: Four times a day (QID) | ORAL | Status: AC
  Administered 2021-08-02 – 2021-08-03 (×4): 500 mg via ORAL
  Filled 2021-08-02 (×4): qty 1

## 2021-08-02 MED ORDER — HYDROCODONE-ACETAMINOPHEN 5-325 MG PO TABS
1.0000 | ORAL_TABLET | ORAL | Status: DC | PRN
  Administered 2021-08-03 – 2021-08-04 (×2): 1 via ORAL
  Filled 2021-08-02 (×2): qty 1

## 2021-08-02 MED ORDER — FERROUS SULFATE 325 (65 FE) MG PO TABS
325.0000 mg | ORAL_TABLET | Freq: Three times a day (TID) | ORAL | Status: DC
  Administered 2021-08-02 – 2021-08-07 (×16): 325 mg via ORAL
  Filled 2021-08-02 (×16): qty 1

## 2021-08-02 MED ORDER — INSULIN ASPART 100 UNIT/ML IJ SOLN
0.0000 [IU] | Freq: Four times a day (QID) | INTRAMUSCULAR | Status: DC
  Administered 2021-08-02 – 2021-08-06 (×3): 3 [IU] via SUBCUTANEOUS
  Administered 2021-08-06: 2 [IU] via SUBCUTANEOUS
  Administered 2021-08-06: 3 [IU] via SUBCUTANEOUS
  Administered 2021-08-07 (×2): 2 [IU] via SUBCUTANEOUS
  Administered 2021-08-07: 3 [IU] via SUBCUTANEOUS

## 2021-08-02 MED ORDER — SODIUM CHLORIDE 0.9 % IV SOLN: INTRAVENOUS | Status: DC

## 2021-08-02 MED ORDER — SUGAMMADEX SODIUM 200 MG/2ML IV SOLN
INTRAVENOUS | Status: DC | PRN
  Administered 2021-08-02: 200 mg via INTRAVENOUS

## 2021-08-02 MED ORDER — INSULIN ASPART 100 UNIT/ML IJ SOLN: 0.0000 [IU] | INTRAMUSCULAR | Status: DC | PRN

## 2021-08-02 MED ORDER — MORPHINE SULFATE (PF) 4 MG/ML IV SOLN
4.0000 mg | INTRAVENOUS | Status: DC | PRN
Start: 2021-08-02 — End: 2021-08-02
  Administered 2021-08-02: 4 mg via INTRAVENOUS
  Filled 2021-08-02: qty 1

## 2021-08-02 MED ORDER — LIDOCAINE 2% (20 MG/ML) 5 ML SYRINGE
INTRAMUSCULAR | Status: DC | PRN
  Administered 2021-08-02: 60 mg via INTRAVENOUS

## 2021-08-02 MED ORDER — ONDANSETRON HCL 4 MG/2ML IJ SOLN
4.0000 mg | Freq: Four times a day (QID) | INTRAMUSCULAR | Status: DC | PRN
Start: 2021-08-02 — End: 2021-08-08
  Administered 2021-08-02: 4 mg via INTRAVENOUS

## 2021-08-02 MED ORDER — CELECOXIB 200 MG PO CAPS: 200.0000 mg | ORAL_CAPSULE | Freq: Once | ORAL | Status: DC

## 2021-08-02 MED ORDER — FENTANYL CITRATE (PF) 250 MCG/5ML IJ SOLN
INTRAMUSCULAR | Status: DC | PRN
  Administered 2021-08-02: 100 ug via INTRAVENOUS
  Administered 2021-08-02: 50 ug via INTRAVENOUS

## 2021-08-02 MED ORDER — MORPHINE SULFATE (PF) 2 MG/ML IV SOLN: 0.5000 mg | INTRAVENOUS | Status: DC | PRN

## 2021-08-02 MED ORDER — MENTHOL 3 MG MT LOZG: 1.0000 | LOZENGE | OROMUCOSAL | Status: DC | PRN

## 2021-08-02 MED ORDER — ACETAMINOPHEN 325 MG PO TABS
325.0000 mg | ORAL_TABLET | Freq: Four times a day (QID) | ORAL | Status: DC | PRN
  Administered 2021-08-03 – 2021-08-05 (×3): 650 mg via ORAL
  Filled 2021-08-02 (×3): qty 2

## 2021-08-02 MED ORDER — SODIUM CHLORIDE 0.9 % IR SOLN
Status: DC | PRN
  Administered 2021-08-02: 1000 mL

## 2021-08-02 MED ORDER — FENTANYL CITRATE (PF) 250 MCG/5ML IJ SOLN
INTRAMUSCULAR | Status: AC
  Filled 2021-08-02: qty 5

## 2021-08-02 MED ORDER — CHLORHEXIDINE GLUCONATE 0.12 % MT SOLN: 15.0000 mL | Freq: Once | OROMUCOSAL | Status: AC

## 2021-08-02 MED ORDER — POLYETHYLENE GLYCOL 3350 17 G PO PACK: 17.0000 g | PACK | Freq: Every day | ORAL | Status: DC | PRN

## 2021-08-02 MED ORDER — DEXAMETHASONE SODIUM PHOSPHATE 10 MG/ML IJ SOLN
INTRAMUSCULAR | Status: DC | PRN
  Administered 2021-08-02: 5 mg via INTRAVENOUS

## 2021-08-02 MED ORDER — DOCUSATE SODIUM 100 MG PO CAPS
100.0000 mg | ORAL_CAPSULE | Freq: Two times a day (BID) | ORAL | Status: DC
  Administered 2021-08-02 – 2021-08-06 (×4): 100 mg via ORAL
  Filled 2021-08-02 (×7): qty 1

## 2021-08-02 MED ORDER — ROCURONIUM BROMIDE 10 MG/ML (PF) SYRINGE
PREFILLED_SYRINGE | INTRAVENOUS | Status: DC | PRN
  Administered 2021-08-02: 50 mg via INTRAVENOUS

## 2021-08-02 SURGICAL SUPPLY — 44 items
BAG COUNTER SPONGE SURGICOUNT (BAG) ×2 IMPLANT
CLSR STERI-STRIP ANTIMIC 1/2X4 (GAUZE/BANDAGES/DRESSINGS) ×2 IMPLANT
COVER MAYO STAND STRL (DRAPES) ×2 IMPLANT
COVER PERINEAL POST (MISCELLANEOUS) ×2 IMPLANT
COVER SURGICAL LIGHT HANDLE (MISCELLANEOUS) ×2 IMPLANT
DRAPE STERI IOBAN 125X83 (DRAPES) ×2 IMPLANT
DRESSING MEPILEX FLEX 4X4 (GAUZE/BANDAGES/DRESSINGS) IMPLANT
DRSG MEPILEX BORDER 4X4 (GAUZE/BANDAGES/DRESSINGS) ×3 IMPLANT
DRSG MEPILEX FLEX 4X4 (GAUZE/BANDAGES/DRESSINGS) ×2
DURAPREP 26ML APPLICATOR (WOUND CARE) ×2 IMPLANT
ELECT REM PT RETURN 9FT ADLT (ELECTROSURGICAL) ×2
ELECTRODE REM PT RTRN 9FT ADLT (ELECTROSURGICAL) ×1 IMPLANT
GLOVE BIO SURGEON STRL SZ7.5 (GLOVE) ×2 IMPLANT
GLOVE BIOGEL PI IND STRL 7.5 (GLOVE) ×1 IMPLANT
GLOVE BIOGEL PI IND STRL 8 (GLOVE) ×1 IMPLANT
GLOVE BIOGEL PI INDICATOR 7.5 (GLOVE) ×1
GLOVE BIOGEL PI INDICATOR 8 (GLOVE) ×1
GLOVE SURG SYN 7.5  E (GLOVE) ×2
GLOVE SURG SYN 7.5 E (GLOVE) ×1 IMPLANT
GLOVE SURG SYN 7.5 PF PI (GLOVE) ×1 IMPLANT
GOWN STRL REUS W/ TWL LRG LVL3 (GOWN DISPOSABLE) ×2 IMPLANT
GOWN STRL REUS W/ TWL XL LVL3 (GOWN DISPOSABLE) ×2 IMPLANT
GOWN STRL REUS W/TWL LRG LVL3 (GOWN DISPOSABLE) ×4
GOWN STRL REUS W/TWL XL LVL3 (GOWN DISPOSABLE) ×4
GUIDEROD T2 3X1000 (ROD) ×1 IMPLANT
K-WIRE  3.2X450M STR (WIRE) ×2
K-WIRE 3.2X450M STR (WIRE) ×1
KIT BASIN OR (CUSTOM PROCEDURE TRAY) ×2 IMPLANT
KIT NAIL LONG 10X360MMX125 (Nail) ×1 IMPLANT
KIT TURNOVER KIT B (KITS) ×2 IMPLANT
KWIRE 3.2X450M STR (WIRE) IMPLANT
MANIFOLD NEPTUNE II (INSTRUMENTS) ×1 IMPLANT
NS IRRIG 1000ML POUR BTL (IV SOLUTION) ×2 IMPLANT
PACK GENERAL/GYN (CUSTOM PROCEDURE TRAY) ×2 IMPLANT
PAD ARMBOARD 7.5X6 YLW CONV (MISCELLANEOUS) ×3 IMPLANT
REAMER SHAFT BIXCUT (INSTRUMENTS) ×1 IMPLANT
SCREW LAG GAMMA 3 95MM (Screw) ×1 IMPLANT
STRIP CLOSURE SKIN 1/2X4 (GAUZE/BANDAGES/DRESSINGS) ×2 IMPLANT
SUT MNCRL AB 4-0 PS2 18 (SUTURE) ×2 IMPLANT
SUT VIC AB 2-0 CT1 27 (SUTURE) ×2
SUT VIC AB 2-0 CT1 TAPERPNT 27 (SUTURE) ×1 IMPLANT
TOWEL GREEN STERILE (TOWEL DISPOSABLE) ×2 IMPLANT
TOWEL OR NON WOVEN STRL DISP B (DISPOSABLE) ×2 IMPLANT
WATER STERILE IRR 1000ML POUR (IV SOLUTION) ×2 IMPLANT

## 2021-08-02 NOTE — Consult Note (Signed)
? ?ORTHOPAEDIC CONSULTATION ? ?REQUESTING PHYSICIAN: Howerter, Justin B, DO ? ?Chief Complaint: left hip pain ? ?HPI: ?Laura Maxwell is a 86 y.o. female with history of hypertension, hyperlipidemia, diabetes,, mild OSA, osteopenia, rheumatoid arthritis who complains of left hip pain after a fall.  Earlier today she tripped over a dust pan in the kitchen and fell on her left hip.  She was was not able to ambulate and was brought to the [facility] emergency department for evaluation.  At this time she states left hip pain is moderate to severe worse with movement and palpation and better with rest and pain medication.  She is not anticoagulated.  Denies chest pain, shortness of breath, nausea, vomiting, or abdominal pain.  No other complaints.  ? ?Past Medical History:  ?Diagnosis Date  ? Allergic   ? Pt is under care of allergy doctor due to swelling of tongue-unsure of med  ? Cataract   ? bil/had surgery  ? Chronic cystitis   ? per [facility]  ? Colon polyp   ? recurrent; per [facility]  ? CTS (carpal tunnel syndrome)   ? per [facility]  ? Diabetes mellitus without complication ([place])   ? Diverticulosis   ? Fatty liver   ? per [facility]  ? H/O vertigo   ? per [facility]  ? Hyperlipidemia   ? Hypertension   ? Neckache   ? per [facility]  ? OSA (obstructive sleep apnea)   ? Mild; per [facility]  ? Osteoarthritis   ? per [facility]  ? Osteopenia   ? per [facility]  ? Rheumatoid arthritis ([place])   ? pt states she has been told she is in remission  ? ?Past Surgical History:  ?Procedure Laterality Date  ? ABDOMINAL SURGERY    ? benign tumor  ? bilateral cataracts  2012  ? per [facility]  ? BUNIONECTOMY    ? right foot  ? DILATION AND CURETTAGE OF UTERUS    ? DILATION AND CURETTAGE OF UTERUS    ? eyelid retraction     ? @ [facility]; per [facility]  ? I&D of 4 Aptic Lesions (MRSA?)    ? per [facility]  ? ROTATOR CUFF REPAIR    ? right  ? uterine polypectomy    ? per [facility]  ? ?Social History  ? ?Socioeconomic History  ? Marital status: Married  ?  Spouse name: Not on file  ? Number of children: 2  ? Years of education: Not on file  ? Highest education level: High school graduate  ?Occupational History  ? Occupation: retired  ?Tobacco Use  ? Smoking status: Never  ? Smokeless tobacco: Never  ?Vaping Use  ? Vaping Use: Never used  ?Substance and Sexual Activity  ? Alcohol use: Never  ? Drug use: Never  ? Sexual activity: Not on file  ?Other Topics Concern  ? Not on file  ?Social History Narrative  ? Lives at home with husband  ? Right handed  ? Caffeine: sometimes in soft drinks   ? ?Social Determinants of Health  ? ?Financial Resource Strain: Not on file  ?Food Insecurity: Not on file  ?Transportation Needs: Not on file  ?Physical Activity: Not on file  ?Stress: Not on file  ?Social Connections: Not on file  ? ?Family History  ?Problem Relation Age of Onset  ? Breast cancer  Mother   ? Bone cancer Mother   ? Heart Problems Mother   ? Other Mother   ?     "mini strokes"  ? Emphysema Father   ? Prostate cancer Father   ? Colon cancer Sister   ? Diabetes Sister   ? Heart disease Brother   ? Diabetes Brother   ? Dementia Neg Hx   ? Alzheimer's disease Neg Hx   ? ?Allergies  ?Allergen Reactions  ? Ace Inhibitors Swelling and Other (See Comments)  ?  The patient experienced angioedema (tongue became swollen) after taking quinapril.  She states that she had been taking this medicine "for years" without any side effects/adverse reactions.  This may also have been caused by the Azo she took. ?  ? Phenazopyridine Swelling and Other (See Comments)  ?  The patient started taking this the week before her reaction.  Her last dose was 4 days prior to the angioedema.  This may also have been cause be her ace inhibitor. ?   ? ? ? ?Positive ROS: All other systems have been reviewed and were otherwise negative with the exception of those mentioned in the HPI and as above. ? ?Physical Exam: ?General: Alert, no acute distress ?Cardiovascular: No pedal edema ?Respiratory: No cyanosis, no use of accessory musculature ?GI: No organomegaly, abdomen is soft and non-tender ?Skin: No lesions in the area of chief complaint ?Neurologic: Sensation intact distally ?Psychiatric: Patient is competent for consent with normal mood and affect ?Lymphatic: No axillary or cervical lymphadenopathy ? ?MUSCULOSKELETAL: Left leg shortened and externally rotated.  EHL and FHL intact bilaterally.  2+ DP pulse bilaterally.  Patient endorses sensation to all aspects of bilateral feet.  Flex and extend toes severely tender to palpation to lateral left hip.  No tender this to palpation to bilateral knees. ? ?Imaging: 2 views of the left hip show left intertrochanteric femur fracture. ? ?Assessment: ?Left intertrochanteric femur fracture. ? ?Plan: ?This is an acute severe injury.  Discussed operative versus nonoperative treatment with the patient.  Risks and benefits of an intramedullary nail were discussed with the patient and she would like to move forward with surgery.  Plan for IM nail with Dr. Percell Miller.  Please keep n.p.o. in anticipation of surgery.   ? ? ? ?Ventura Bruns, PA-C ? ? ? ?08/02/2021 ?2:39 AM ?  ?

## 2021-08-02 NOTE — Progress Notes (Signed)
Patient admitted earlier this am, admitted by Dr Velia Meyer , please see note for detailed H&P. ? ?Laura Maxwell is a 86 y.o. female with medical history significant for type 2 diabetes mellitus, hypertension, hyperlipidemia who is admitted to [facility] on 08/01/2021 with acute left intratrochanteric hip fracture after presenting from home to [facility] ED complaining of left hip pain.  ? ?Orthopedics consulted and plan for surgical repair of the hip.  ? ?Hosie Poisson, MD ? ?

## 2021-08-02 NOTE — Op Note (Signed)
DATE OF SURGERY:  08/02/2021 ? ?TIME: 11:52 AM ? ?PATIENT NAME:  Laura Maxwell [facility] ? ?AGE: 86 y.o. ? ?PRE-OPERATIVE DIAGNOSIS:  Left hip fracture ? ?POST-OPERATIVE DIAGNOSIS:  SAME ? ?PROCEDURE:  INTRAMEDULLARY (IM) NAIL INTERTROCHANTRIC ? ?SURGEON:  [name] ? ?ASSISTANT:  Aggie Moats, PA-C, he was present and scrubbed throughout the case, critical for completion in a timely fashion, and for retraction, instrumentation, and closure. ? ? ?OPERATIVE IMPLANTS: [employer]ker Gamma Nail ? ?PREOPERATIVE INDICATIONS:  [name] is a 86 y.o. year old who fell and suffered a hip fracture. She was brought into the ER and then admitted and optimized and then elected for surgical intervention.   ? ?The risks benefits and alternatives were discussed with the patient including but not limited to the risks of nonoperative treatment, versus surgical intervention including infection, bleeding, nerve injury, malunion, nonunion, hardware prominence, hardware failure, need for hardware removal, blood clots, cardiopulmonary complications, morbidity, mortality, among others, and they were willing to proceed.   ? ?OPERATIVE PROCEDURE: ? ?The patient was brought to the operating room and placed in the supine position. General anesthesia was administered. She was placed on the fracture table.  Closed reduction was performed under C-arm guidance. Time out was then performed after sterile prep and drape. She received preoperative antibiotics. ? ?I first performed a closed manipulation of the fracture to reduce it on the OR table. ? ?Incision was made proximal to the greater trochanter. A guidewire was placed in the appropriate position. Confirmation was made on AP and lateral views. The above-named nail was opened. I opened the proximal femur with a reamer. I then placed the nail by hand easily down. I did not need to ream the femur. ? ?Once the nail was completely seated, I placed a guidepin into the femoral head into the center center  position. I measured the length, and then reamed the lateral cortex and up into the head. I then placed the lag screw. Slight compression was applied. Anatomic fixation achieved. ? ?I then secured the proximal interlocking bolt, and took off a half a turn, and then removed the instruments, and took final C-arm pictures AP and lateral the entire length of the leg. ? ? Anatomic reconstruction was achieved, and the wounds were irrigated copiously and closed with a layered closure. The patient was awakened and returned to PACU in stable and satisfactory condition. There no complications and the patient tolerated the procedure well. ? ?She will be weightbearing as tolerated, and will be on chemical px  for a period of four weeks after discharge. ? ? ?[name], M.D. ? ?

## 2021-08-02 NOTE — Anesthesia Postprocedure Evaluation (Signed)
Anesthesia Post Note ? ?Patient: Laura Maxwell [facility] ? ?Procedure(s) Performed: INTRAMEDULLARY (IM) NAIL INTERTROCHANTRIC (Left: Hip) ? ?  ? ?Patient location during evaluation: PACU ?Anesthesia Type: General ?Level of consciousness: awake and alert ?Pain management: pain level controlled ?Vital Signs Assessment: post-procedure vital signs reviewed and stable ?Respiratory status: spontaneous breathing, nonlabored ventilation, respiratory function stable and patient connected to nasal cannula oxygen ?Cardiovascular status: blood pressure returned to baseline and stable ?Postop Assessment: no apparent nausea or vomiting ?Anesthetic complications: no ? ? ?No notable events documented. ? ?Last Vitals:  ?Vitals:  ? 08/02/21 1425 08/02/21 1440  ?BP: (!) 178/61 (!) 178/61  ?Pulse: 64 71  ?Resp: 20 17  ?Temp:  36.6 ?C  ?SpO2: 97% 98%  ?  ?Last Pain:  ?Vitals:  ? 08/02/21 1440  ?TempSrc:   ?PainSc: 0-No pain  ? ? ?  ?  ?  ?  ?  ?  ? ?Laura Maxwell ? ? ? ? ?

## 2021-08-02 NOTE — Anesthesia Preprocedure Evaluation (Addendum)
Anesthesia Evaluation  ?Patient identified by MRN, date of birth, ID band ?Patient awake ? ? ? ?Reviewed: ?Allergy & Precautions, NPO status , Patient's Chart, lab work & pertinent test results ? ?Airway ?Mallampati: II ? ? ? ? ? ? Dental ? ?(+) Partial Upper ?  ?Pulmonary ?sleep apnea ,  ?  ?Pulmonary exam normal ? ? ? ? ? ? ? Cardiovascular ?hypertension, Pt. on medications and Pt. on home beta blockers ? ?Rhythm:Regular Rate:Normal ? ? ?  ?Neuro/Psych ?negative neurological ROS ? negative psych ROS  ? GI/Hepatic ?negative GI ROS, Neg liver ROS,   ?Endo/Other  ?diabetes, Type 2, Oral Hypoglycemic Agents ? Renal/GU ?negative Renal ROS  ?negative genitourinary ?  ?Musculoskeletal ? ?(+) Arthritis , Rheumatoid disorders,  Left hip fx  ? Abdominal ?Normal abdominal exam  (+)   ?Peds ? Hematology ?negative hematology ROS ?(+)   ?Anesthesia Other Findings ? ? Reproductive/Obstetrics ? ?  ? ? ? ? ? ? ? ? ? ? ? ? ? ?  ?  ? ? ? ? ? ? ? ?Anesthesia Physical ?Anesthesia Plan ? ?ASA: 2 ? ?Anesthesia Plan: General  ? ?Post-op Pain Management: Tylenol PO (pre-op)* and Celebrex PO (pre-op)*  ? ?Induction: Intravenous ? ?PONV Risk Score and Plan: 3 and Ondansetron, Dexamethasone and Treatment may vary due to age or medical condition ? ?Airway Management Planned: Mask and Oral ETT ? ?Additional Equipment: None ? ?Intra-op Plan:  ? ?Post-operative Plan: Extubation in OR ? ?Informed Consent: I have reviewed the patients History and Physical, chart, labs and discussed the procedure including the risks, benefits and alternatives for the proposed anesthesia with the patient or authorized representative who has indicated his/her understanding and acceptance.  ? ? ? ?Dental advisory given ? ?Plan Discussed with: CRNA ? ?Anesthesia Plan Comments: (Lab Results ?     Component                Value               Date                 ?     WBC                      11.7 (H)            08/02/2021           ?      HGB                      15.0                08/02/2021           ?     HCT                      44.6                08/02/2021           ?     MCV                      94.9                08/02/2021           ?     PLT  215                 08/02/2021           ?Lab Results ?     Component                Value               Date                 ?     NA                       139                 08/02/2021           ?     K                        3.7                 08/02/2021           ?     CO2                      22                  08/02/2021           ?     GLUCOSE                  235 (H)             08/02/2021           ?     BUN                      13                  08/02/2021           ?     CREATININE               0.75                08/02/2021           ?     CALCIUM                  9.5                 08/02/2021           ?     GFRNONAA                 >60                 08/02/2021          )  ? ? ? ? ? ? ?Anesthesia Quick Evaluation ? ?

## 2021-08-02 NOTE — H&P (View-Only) (Signed)
? ?ORTHOPAEDIC CONSULTATION ? ?REQUESTING PHYSICIAN: Howerter, Justin B, DO ? ?Chief Complaint: left hip pain ? ?HPI: ?Laura Maxwell is a 86 y.o. female with history of hypertension, hyperlipidemia, diabetes,, mild OSA, osteopenia, rheumatoid arthritis who complains of left hip pain after a fall.  Earlier today she tripped over a dust pan in the kitchen and fell on her left hip.  She was was not able to ambulate and was brought to the [facility] emergency department for evaluation.  At this time she states left hip pain is moderate to severe worse with movement and palpation and better with rest and pain medication.  She is not anticoagulated.  Denies chest pain, shortness of breath, nausea, vomiting, or abdominal pain.  No other complaints.  ? ?Past Medical History:  ?Diagnosis Date  ? Allergic   ? Pt is under care of allergy doctor due to swelling of tongue-unsure of med  ? Cataract   ? bil/had surgery  ? Chronic cystitis   ? per [facility]  ? Colon polyp   ? recurrent; per [facility]  ? CTS (carpal tunnel syndrome)   ? per [facility]  ? Diabetes mellitus without complication ([place])   ? Diverticulosis   ? Fatty liver   ? per [facility]  ? H/O vertigo   ? per [facility]  ? Hyperlipidemia   ? Hypertension   ? Neckache   ? per [facility]  ? OSA (obstructive sleep apnea)   ? Mild; per [facility]  ? Osteoarthritis   ? per [facility]  ? Osteopenia   ? per [facility]  ? Rheumatoid arthritis ([place])   ? pt states she has been told she is in remission  ? ?Past Surgical History:  ?Procedure Laterality Date  ? ABDOMINAL SURGERY    ? benign tumor  ? bilateral cataracts  2012  ? per [facility]  ? BUNIONECTOMY    ? right foot  ? DILATION AND CURETTAGE OF UTERUS    ? DILATION AND CURETTAGE OF UTERUS    ? eyelid retraction     ? @ [facility]; per [employer]  ? I&D of 4 Aptic Lesions (MRSA?)    ? per [facility]  ? ROTATOR CUFF REPAIR    ? right  ? uterine polypectomy    ? per [facility]  ? ?Social History  ? ?Socioeconomic History  ? Marital status: Married  ?  Spouse name: Not on file  ? Number of children: 2  ? Years of education: Not on file  ? Highest education level: High school graduate  ?Occupational History  ? Occupation: retired  ?Tobacco Use  ? Smoking status: Never  ? Smokeless tobacco: Never  ?Vaping Use  ? Vaping Use: Never used  ?Substance and Sexual Activity  ? Alcohol use: Never  ? Drug use: Never  ? Sexual activity: Not on file  ?Other Topics Concern  ? Not on file  ?Social History Narrative  ? Lives at home with husband  ? Right handed  ? Caffeine: sometimes in soft drinks   ? ?Social Determinants of Health  ? ?Financial Resource Strain: Not on file  ?Food Insecurity: Not on file  ?Transportation Needs: Not on file  ?Physical Activity: Not on file  ?Stress: Not on file  ?Social Connections: Not on file  ? ?Family History  ?Problem Relation Age of Onset  ? Breast cancer  Mother   ? Bone cancer Mother   ? Heart Problems Mother   ? Other Mother   ?     "mini strokes"  ? Emphysema Father   ? Prostate cancer Father   ? Colon cancer Sister   ? Diabetes Sister   ? Heart disease Brother   ? Diabetes Brother   ? Dementia Neg Hx   ? Alzheimer's disease Neg Hx   ? ?Allergies  ?Allergen Reactions  ? Ace Inhibitors Swelling and Other (See Comments)  ?  The patient experienced angioedema (tongue became swollen) after taking quinapril.  She states that she had been taking this medicine "for years" without any side effects/adverse reactions.  This may also have been caused by the Azo she took. ?  ? Phenazopyridine Swelling and Other (See Comments)  ?  The patient started taking this the week before her reaction.  Her last dose was 4 days prior to the angioedema.  This may also have been cause be her ace inhibitor. ?   ? ? ? ?Positive ROS: All other systems have been reviewed and were otherwise negative with the exception of those mentioned in the HPI and as above. ? ?Physical Exam: ?General: Alert, no acute distress ?Cardiovascular: No pedal edema ?Respiratory: No cyanosis, no use of accessory musculature ?GI: No organomegaly, abdomen is soft and non-tender ?Skin: No lesions in the area of chief complaint ?Neurologic: Sensation intact distally ?Psychiatric: Patient is competent for consent with normal mood and affect ?Lymphatic: No axillary or cervical lymphadenopathy ? ?MUSCULOSKELETAL: Left leg shortened and externally rotated.  EHL and FHL intact bilaterally.  2+ DP pulse bilaterally.  Patient endorses sensation to all aspects of bilateral feet.  Flex and extend toes severely tender to palpation to lateral left hip.  No tender this to palpation to bilateral knees. ? ?Imaging: 2 views of the left hip show left intertrochanteric femur fracture. ? ?Assessment: ?Left intertrochanteric femur fracture. ? ?Plan: ?This is an acute severe injury.  Discussed operative versus nonoperative treatment with the patient.  Risks and benefits of an intramedullary nail were discussed with the patient and she would like to move forward with surgery.  Plan for IM nail with Dr. Percell Miller.  Please keep n.p.o. in anticipation of surgery.   ? ? ? ?Ventura Bruns, PA-C ? ? ? ?08/02/2021 ?2:39 AM ?  ?

## 2021-08-02 NOTE — ED Notes (Signed)
Pts necklace, 2 ear rings, and ring placed into belonging cup and placed in patients purse per pts request.  ?

## 2021-08-02 NOTE — Assessment & Plan Note (Addendum)
#)   Type 2 Diabetes Mellitus: Home insulin regimen: None. Home oral hypoglycemic agents: Jardiance, metformin. presenting blood sugar: 297, without any evidence of corresponding anion gap metabolic acidosis.   ?-Hemoglobin A1c 8.8 (08/02/2021 ). ?-Patient maintained on sliding scale insulin and metformin during the hospitalization.   ?-Home regimen Jardiance will be resumed on discharge.   ?-Patient will be discharged to skilled nursing facility. ?

## 2021-08-02 NOTE — Assessment & Plan Note (Addendum)
#)   Leukocytosis: ?-Patient noted to have a elevated white count on admission of 13.8.  ?-Felt likely reactive in nature secondary to hip fracture.  ?-Chest x-ray done negative for any acute infiltrates.  ?-Urinalysis done was nitrite positive leukocytes negative, 0-5 WBCs. ?-Urine cultures done with 1000 colonies of Klebsiella pneumoniae. ?-Patient is status post 3 days IV Rocephin. ?-Leukocytosis trended down and had resolved by day of discharge. ?-No further antibiotics needed. ?

## 2021-08-02 NOTE — Assessment & Plan Note (Addendum)
? ?#)   Acute left intratrochanteric hip fracture: confirmed via presenting plain films and stemming from ground level mechanical fall without associated loss of consciousness that occurred earlier on the day of admission. ?-Patient seen in consultation by orthopedics, Dr. Percell Miller and patient subsequently underwent surgical repair 08/02/2021 without any complications. ?-Patient seen by PT/ OT who are recommending SNF placement. ?-Patient placed on aspirin 81 mg twice daily x30 days per orthopedics for DVT prophylaxis. ?-WBAT. ?-Pain management per orthopedics. ?-Outpatient follow-up with orthopedics 2 weeks post discharge. ?

## 2021-08-02 NOTE — Assessment & Plan Note (Addendum)
Hyperlipidemia: documented h/o such. On rosuvastatin as outpatient.  ?-Resume rosuvastatin on discharge. ? ? ? ? ? ?

## 2021-08-02 NOTE — Assessment & Plan Note (Addendum)
#)   Ground level mechanical fall: The patient reports a ground level mechanical fall in which she tripped without any associated loss of consciousness. Appears to be purely mechanical in nature, without clinical evidence at this time to suggest contributory dizziness, presyncope, syncope, or acute ischemic CVA. Does not appear to have hit head as component of this fall. presentation does not appear to be associated any acute neurologic deficits.  ?-Patient followed by PT/OT who are recommended SNF. ? ?  ? ?

## 2021-08-02 NOTE — H&P (Signed)
?History and Physical  ? ? ?PLEASE NOTE THAT DRAGON DICTATION SOFTWARE WAS USED IN THE CONSTRUCTION OF THIS NOTE. ? ? ?HIDEKO Maxwell ERD:[id number] DOB: 02-18-1936 DOA: 08/01/2021 ? ?PCP: Shon Baton, MD  ?Patient coming from: home  ? ?I have personally briefly reviewed patient's old medical records in [place] ? ?Chief Complaint: Left hip pain ? ?HPI: Laura Maxwell is a 86 y.o. female with medical history significant for type 2 diabetes mellitus, hypertension, hyperlipidemia who is admitted to [facility] on 08/01/2021 with acute left intratrochanteric hip fracture after presenting from home to [facility] ED complaining of left hip pain.  ? ?The following history is obtained via my discussions with the patient as well as my discussions with the EDP, and via chart review. ? ?Patient reports tripping while attempting to ambulate at home, resulting in a ground level fall during which left hip was the principal point of contact with the floor below. As a result of this fall, the patient reports immediate development of sharp left hip pain, with radiation into the left groin. States that this pain has been constant since onset, with exacerbation when attempting to move the left lower extremity.  As a consequence of the associated intensity of this discomfort, reports that he is unable to bear weight on the left lower extremity at this time.  This is relative to baseline functional status in which the patient lives independently as well as baseline ambulatory status in which the patient reports the ability to ambulate without assistance, including no need for support devices.  Otherwise, denies any acute arthralgias or myalgias as a result of the above fall.  Denies any acute numbness or paresthesias in bilateral lower extremities, and confirms that left hip representations a native joint. ? ?Did not hit head as a component of this fall, and denies any associated loss of consciousness.  Denies any preceding or  associated chest pain, shortness of breath, diaphoresis, palpitations, nausea, vomiting, dizziness, presyncope, or syncope.  Denies any subsequent headache, neck pain, blurry vision, or diplopia.  Not on any blood thinners as an outpatient, including no aspirin.  Denies any known history of coronary artery disease or CHF. denies any recent orthopnea, PND, or peripheral edema.  ? ? ? ? ?ED Course:  ?Vital signs in the ED were notable for the following: Afebrile; heart rate 66-83; initial blood pressure 180/78, which is subsequently decreased to 145/73 following interval improvement in pain control via administration of IV analgesia, as further detailed below; respiratory rate 15-17, oxygen saturation 95 to 97% on room air. ? ?Labs were notable for the following: BMP notable for the following: Sodium 139, bicarbonate 23, anion gap 10, creatinine 0.90, glucose 297.  CBC notable for white blood cell count 13,800 with 80% neutrophils, hemoglobin 15.4, platelet count 254.  INR 0.9. ? ?Imaging and additional notable ED work-up: EKG shows sinus rhythm with heart rate 66, normal intervals, and no evidence of T wave or ST changes, including no evidence of ST elevation.  Plain films of the left hip show acute left femoral intratrochanteric fracture.  ? ?EDP discussed the patient's case and imaging with the on-call orthopedic surgeon, Dr. [name], who recommended admission to the hospitalist service for further evaluation and management of acute left hip fracture.  Dr. Percell Miller to formally consult, will evaluate the patient in the morning. Will strive to take patient to OR on 08/02/21 for definitive surgical correction of her acute left hip fracture, however, this will also  depend upon OR availability. In the meantime, Dr. Percell Miller requested the patient be kept n.p.o. ? ?While in the ED, the following were administered: Morphine 4 mg IV x1 dose. ? ?Subsequently, the patient was admitted for further evaluation management,  including definitive surgical correction of presenting acute left intratrochanteric hip fracture in the setting of presenting ground-level mechanical fall, with presenting labs notable for mild leukocytosis. ? ? ?Review of Systems: As per HPI otherwise 10 point review of systems negative.  ? ?Past Medical History:  ?Diagnosis Date  ? Allergic   ? Pt is under care of allergy doctor due to swelling of tongue-unsure of med  ? Cataract   ? bil/had surgery  ? Chronic cystitis   ? per [facility]  ? Colon polyp   ? recurrent; per [facility]  ? CTS (carpal tunnel syndrome)   ? per [facility]  ? Diabetes mellitus without complication ([place])   ? Diverticulosis   ? Fatty liver   ? per [facility]  ? H/O vertigo   ? per [facility]  ? Hyperlipidemia   ? Hypertension   ? Neckache   ? per [facility]  ? OSA (obstructive sleep apnea)   ? Mild; per [facility]  ? Osteoarthritis   ? per [facility]  ? Osteopenia   ? per [facility]  ? Rheumatoid arthritis ([place])   ? pt states she has been told she is in remission  ? ? ?Past Surgical History:  ?Procedure Laterality Date  ? ABDOMINAL SURGERY    ? benign tumor  ? bilateral cataracts  2012  ? per [facility]  ? BUNIONECTOMY    ? right foot  ? DILATION AND CURETTAGE OF UTERUS    ? DILATION AND CURETTAGE OF UTERUS    ? eyelid retraction     ? @ [facility]; per [employer]  ? I&D of 4 Aptic Lesions (MRSA?)    ? per [facility]  ? ROTATOR CUFF REPAIR    ? right  ? uterine polypectomy    ? per [facility]  ? ? ?Social History: ? reports that she has never smoked. She has never used smokeless tobacco. She reports that she does not drink alcohol and does not use drugs. ? ? ?Allergies  ?Allergen Reactions  ? Ace Inhibitors Swelling and Other (See Comments)  ?  The patient experienced angioedema  (tongue became swollen) after taking quinapril.  She states that she had been taking this medicine "for years" without any side effects/adverse reactions.  This may also have been caused by the Azo she took. ?  ? Phenazopyridine Swelling and Other (See Comments)  ?  The patient started taking this the week before her reaction.  Her last dose was 4 days prior to the angioedema.  This may also have been cause be her ace inhibitor. ?  ? ? ?Family History  ?Problem Relation Age of Onset  ? Breast cancer Mother   ? Bone cancer Mother   ? Heart Problems Mother   ? Other Mother   ?     "mini strokes"  ? Emphysema Father   ? Prostate cancer Father   ? Colon cancer Sister   ? Diabetes Sister   ? Heart disease Brother   ? Diabetes Brother   ? Dementia Neg Hx   ? Alzheimer's disease Neg Hx   ? ? ?Family history reviewed and not  pertinent  ? ? ?Prior to Admission medications   ?Medication Sig Start Date End Date Taking? Authorizing Provider  ?amLODipine (NORVASC) 5 MG tablet Take 5 mg by mouth at bedtime.    Yes [provider]  ?clindamycin (CLEOCIN) 300 MG capsule Take 1 capsule (300 mg total) by mouth 4 (four) times daily. X 7 days 07/30/21  Yes Delo, Nathaneil Canary, MD  ?clobetasol cream (TEMOVATE) 2.99 % Place 1 application vaginally daily as needed for itching. 04/13/18  Yes [provider]  ?donepezil (ARICEPT) 5 MG tablet Take 5 mg by mouth at bedtime.   Yes [provider]  ?folic acid (FOLVITE) 1 MG tablet Take 1 mg by mouth daily. 02/07/19  Yes [provider]  ?JARDIANCE 10 MG TABS tablet Take 10 mg by mouth daily before breakfast. 07/23/18  Yes [provider]  ?labetalol (NORMODYNE) 300 MG tablet Take 300 mg by mouth 2 (two) times daily.    Yes [provider]  ?metFORMIN (GLUCOPHAGE) 500 MG tablet Take 500 mg by mouth daily with breakfast. 06/18/18  Yes [provider]  ?methotrexate (RHEUMATREX) 2.5 MG tablet Take 15 mg by mouth once a week. 02/06/19  Yes  [provider]  ?rosuvastatin (CRESTOR) 5 MG tablet Take 5 mg by mouth. Daily for 5 days per week.   Yes [provider]  ?spironolactone (ALDACTONE) 25 MG tablet Take 25 mg by mouth daily.   Yes

## 2021-08-02 NOTE — Anesthesia Procedure Notes (Signed)
Procedure Name: Intubation ?Date/Time: 08/02/2021 11:21 AM ?Performed by: Moshe Salisbury, CRNA ?Pre-anesthesia Checklist: Patient identified, Emergency Drugs available, Suction available and Patient being monitored ?Patient Re-evaluated:Patient Re-evaluated prior to induction ?Oxygen Delivery Method: Circle System Utilized ?Preoxygenation: Pre-oxygenation with 100% oxygen ?Induction Type: IV induction ?Ventilation: Mask ventilation without difficulty ?Laryngoscope Size: Mac and 3 ?Grade View: Grade II ?Tube type: Oral ?Tube size: 7.5 mm ?Number of attempts: 1 ?Airway Equipment and Method: Stylet ?Placement Confirmation: ETT inserted through vocal cords under direct vision, positive ETCO2 and breath sounds checked- equal and bilateral ?Secured at: 21 cm ?Tube secured with: Tape ?Dental Injury: Teeth and Oropharynx as per pre-operative assessment  ? ? ? ? ?

## 2021-08-02 NOTE — Interval H&P Note (Signed)
History and Physical Interval Note: ? ?08/02/2021 ?11:05 AM ? ?Laura Maxwell  has presented today for surgery, with the diagnosis of Left hip fracture.  The various methods of treatment have been discussed with the patient and family. After consideration of risks, benefits and other options for treatment, the patient has consented to  Procedure(s): ?INTRAMEDULLARY (IM) NAIL INTERTROCHANTRIC (Left) as a surgical intervention.  The patient's history has been reviewed, patient examined, no change in status, stable for surgery.  I have reviewed the patient's chart and labs.  Questions were answered to the patient's satisfaction.   ? ? ?[name] ? ? ?

## 2021-08-02 NOTE — Transfer of Care (Signed)
Immediate Anesthesia Transfer of Care Note ? ?Patient: Laura Maxwell [facility] ? ?Procedure(s) Performed: INTRAMEDULLARY (IM) NAIL INTERTROCHANTRIC (Left: Hip) ? ?Patient Location: PACU ? ?Anesthesia Type:General ? ?Level of Consciousness: drowsy and patient cooperative ? ?Airway & Oxygen Therapy: Patient Spontanous Breathing and Patient connected to nasal cannula oxygen ? ?Post-op Assessment: Report given to RN and Post -op Vital signs reviewed and stable ? ?Post vital signs: Reviewed and stable ? ?Last Vitals:  ?Vitals Value Taken Time  ?BP 186/77 08/02/21 1355  ?Temp    ?Pulse 67 08/02/21 1356  ?Resp 14 08/02/21 1356  ?SpO2 96 % 08/02/21 1356  ?Vitals shown include unvalidated device data. ? ?Last Pain:  ?Vitals:  ? 08/02/21 1000  ?TempSrc: Oral  ?PainSc:   ?   ? ?  ? ?Complications: No notable events documented. ?

## 2021-08-02 NOTE — Assessment & Plan Note (Addendum)
Essential Hypertension: documented h/o such, with outpatient antihypertensive regimen including oral labetalol, amlodipine, spironolactone.  ?-Systolic blood pressures in the 140s.  ?-Patient initially placed back on home regimen labetalol and Norvasc and spironolactone held. ?-Spironolactone subsequently resumed. ?-Outpatient follow-up. ?

## 2021-08-03 DIAGNOSIS — I10 Essential (primary) hypertension: Secondary | ICD-10-CM | POA: Diagnosis not present

## 2021-08-03 DIAGNOSIS — S72002D Fracture of unspecified part of neck of left femur, subsequent encounter for closed fracture with routine healing: Secondary | ICD-10-CM | POA: Diagnosis not present

## 2021-08-03 DIAGNOSIS — E119 Type 2 diabetes mellitus without complications: Secondary | ICD-10-CM | POA: Diagnosis not present

## 2021-08-03 LAB — CBC WITH DIFFERENTIAL/PLATELET
Abs Immature Granulocytes: 0.09 10*3/uL — ABNORMAL HIGH (ref 0.00–0.07)
Basophils Absolute: 0.1 10*3/uL (ref 0.0–0.1)
Basophils Relative: 1 %
Eosinophils Absolute: 0.1 10*3/uL (ref 0.0–0.5)
Eosinophils Relative: 1 %
HCT: 43.8 % (ref 36.0–46.0)
Hemoglobin: 14.4 g/dL (ref 12.0–15.0)
Immature Granulocytes: 1 %
Lymphocytes Relative: 11 %
Lymphs Abs: 1.2 10*3/uL (ref 0.7–4.0)
MCH: 31.7 pg (ref 26.0–34.0)
MCHC: 32.9 g/dL (ref 30.0–36.0)
MCV: 96.5 fL (ref 80.0–100.0)
Monocytes Absolute: 1.7 10*3/uL — ABNORMAL HIGH (ref 0.1–1.0)
Monocytes Relative: 15 %
Neutro Abs: 8.3 10*3/uL — ABNORMAL HIGH (ref 1.7–7.7)
Neutrophils Relative %: 71 %
Platelets: 196 10*3/uL (ref 150–400)
RBC: 4.54 MIL/uL (ref 3.87–5.11)
RDW: 12.7 % (ref 11.5–15.5)
WBC: 11.6 10*3/uL — ABNORMAL HIGH (ref 4.0–10.5)
nRBC: 0 % (ref 0.0–0.2)

## 2021-08-03 LAB — GLUCOSE, CAPILLARY
Glucose-Capillary: 129 mg/dL — ABNORMAL HIGH (ref 70–99)
Glucose-Capillary: 147 mg/dL — ABNORMAL HIGH (ref 70–99)
Glucose-Capillary: 148 mg/dL — ABNORMAL HIGH (ref 70–99)
Glucose-Capillary: 244 mg/dL — ABNORMAL HIGH (ref 70–99)

## 2021-08-03 LAB — BASIC METABOLIC PANEL
Anion gap: 9 (ref 5–15)
BUN: 20 mg/dL (ref 8–23)
CO2: 25 mmol/L (ref 22–32)
Calcium: 9.4 mg/dL (ref 8.9–10.3)
Chloride: 104 mmol/L (ref 98–111)
Creatinine, Ser: 0.81 mg/dL (ref 0.44–1.00)
GFR, Estimated: >60 mL/min (ref >60)
Glucose, Bld: 149 mg/dL — ABNORMAL HIGH (ref 70–99)
Potassium: 4.1 mmol/L (ref 3.5–5.1)
Sodium: 138 mmol/L (ref 135–145)

## 2021-08-03 MED ORDER — METFORMIN HCL 500 MG PO TABS
500.0000 mg | ORAL_TABLET | Freq: Every day | ORAL | Status: DC
  Administered 2021-08-04 – 2021-08-07 (×4): 500 mg via ORAL
  Filled 2021-08-03 (×4): qty 1

## 2021-08-03 MED ORDER — FOLIC ACID 1 MG PO TABS
1.0000 mg | ORAL_TABLET | Freq: Every day | ORAL | Status: DC
Start: 2021-08-03 — End: 2021-08-08
  Administered 2021-08-03 – 2021-08-07 (×5): 1 mg via ORAL
  Filled 2021-08-03 (×5): qty 1

## 2021-08-03 MED ORDER — LABETALOL HCL 300 MG PO TABS
300.0000 mg | ORAL_TABLET | Freq: Two times a day (BID) | ORAL | Status: DC
  Administered 2021-08-03 – 2021-08-07 (×9): 300 mg via ORAL
  Filled 2021-08-03 (×10): qty 1

## 2021-08-03 MED ORDER — AMLODIPINE BESYLATE 5 MG PO TABS
5.0000 mg | ORAL_TABLET | Freq: Every day | ORAL | Status: DC
  Administered 2021-08-03 – 2021-08-06 (×4): 5 mg via ORAL
  Filled 2021-08-03 (×4): qty 1

## 2021-08-03 MED ORDER — DONEPEZIL HCL 5 MG PO TABS
5.0000 mg | ORAL_TABLET | Freq: Every day | ORAL | Status: DC
Start: 2021-08-03 — End: 2021-08-08
  Administered 2021-08-03 – 2021-08-06 (×4): 5 mg via ORAL
  Filled 2021-08-03 (×4): qty 1

## 2021-08-03 NOTE — Progress Notes (Signed)
Subjective: ?1 Day Post-Op Procedure(s) (LRB): ?INTRAMEDULLARY (IM) NAIL INTERTROCHANTRIC (Left) ?Patient reports pain as mild.   ? ?Objective: ?Vital signs in last 24 hours: ?Temp:  [97.9 ?F (36.6 ?C)-98.5 ?F (36.9 ?C)] 98.5 ?F (36.9 ?C) (05/07 0441) ?Pulse Rate:  [64-77] 77 (05/07 0441) ?Resp:  [9-20] 20 (05/07 0441) ?BP: (139-186)/(61-87) 139/71 (05/07 0441) ?SpO2:  [97 %-100 %] 100 % (05/07 0441) ?Weight:  [75 kg] 75 kg (05/06 1000) ? ?Intake/Output from previous day: ?05/06 0701 - 05/07 0700 ?In: 925 [I.V.:725; IV Piggyback:200] ?Out: 725 [Urine:600; Blood:125] ?Intake/Output this shift: ?Total I/O ?In: 182.8 [I.V.:182.8] ?Out: -  ? ?Recent Labs  ?  08/01/21 ?2338 08/02/21 ?0400  ?HGB 15.4* 15.0  ? ?Recent Labs  ?  08/01/21 ?2338 08/02/21 ?0400  ?WBC 13.8* 11.7*  ?RBC 4.84 4.70  ?HCT 45.7 44.6  ?PLT 254 215  ? ?Recent Labs  ?  08/01/21 ?2338 08/02/21 ?0400  ?NA 139 139  ?K 3.8 3.7  ?CL 106 107  ?CO2 23 22  ?BUN 15 13  ?CREATININE 0.90 0.75  ?GLUCOSE 297* 235*  ?CALCIUM 9.7 9.5  ? ?Recent Labs  ?  08/01/21 ?2338  ?INR 0.9  ? ? ?Neurovascular intact ?Sensation intact distally ?Intact pulses distally ?Dorsiflexion/Plantar flexion intact ?Incision: dressing C/D/I ? ? ?Assessment/Plan: ?1 Day Post-Op Procedure(s) (LRB): ?INTRAMEDULLARY (IM) NAIL INTERTROCHANTRIC (Left) ?  ?WBAT LLE ?Up with PT/OT ?Pain control as ordered ?ASA bid x4 weeks dvt proph ?Will likely need SNF at d/c her and her husband both have some dementia.  Pt did not use a walker or gait aid at baseline.   ?Will cont to f/u while in house, plan on f/u with Dr. Fredonia Highland in 2 weeks. ? ? ?Chriss Czar ?08/03/2021, 8:30 AM ? ?

## 2021-08-03 NOTE — Evaluation (Signed)
Occupational Therapy Evaluation ?Patient Details ?Name: Laura Maxwell [facility] ?MRN: [id number] ?DOB: March 06, 1936 ?Today's Date: 08/03/2021 ? ? ?History of Present Illness Pt is an 86 y.o. female who fell at home sustaining L hip fx. She underwent IM nailing 5/6. PMH: type 2 diabetes mellitus, hypertension, hyperlipidemia  ? ?Clinical Impression ?  ?Pt reports independence at baseline with ADLs and functional mobility. Lives with spouse who can provide assist at d/c. Pt currently requiring min-mod A for ADLs, mod A for bed mobility, and min A for transfers with RW. Pt requiring cuing throughout for hand placement, safety precautions. Pt reporting dizziness after toilet transfer, BP soft, returned to supine and BP WNL. Pt presenting with impairments listed below, will follow acutely. Recommend AIR at d/c.  ?   ? ?Recommendations for follow up therapy are one component of a multi-disciplinary discharge planning process, led by the attending physician.  Recommendations may be updated based on patient status, additional functional criteria and insurance authorization.  ? ?Follow Up Recommendations ? Acute inpatient rehab (3hours/day)  ?  ?Assistance Recommended at Discharge Intermittent Supervision/Assistance  ?Patient can return home with the following A little help with walking and/or transfers;A little help with bathing/dressing/bathroom;Assistance with cooking/housework;Assist for transportation;Help with stairs or ramp for entrance ? ?  ?Functional Status Assessment ? Patient has had a recent decline in their functional status and demonstrates the ability to make significant improvements in function in a reasonable and predictable amount of time.  ?Equipment Recommendations ? BSC/3in1  ?  ?Recommendations for Other Services   ? ? ?  ?Precautions / Restrictions Precautions ?Precautions: Fall ?Precaution Comments: watch BP ?Restrictions ?Weight Bearing Restrictions: Yes ?LLE Weight Bearing: Weight bearing as tolerated  ? ?   ? ?Mobility Bed Mobility ?Overal bed mobility: Needs Assistance ?Bed Mobility: Sit to Supine ?  ?  ?  ?Sit to supine: Mod assist ?  ?General bed mobility comments: to bring BLE's into bed ?  ? ?Transfers ?Overall transfer level: Needs assistance ?Equipment used: Rolling walker (2 wheels) ?Transfers: Sit to/from Stand ?Sit to Stand: Min assist ?  ?  ?  ?  ?  ?  ?  ? ?  ?Balance Overall balance assessment: Needs assistance ?Sitting-balance support: Feet supported, No upper extremity supported ?Sitting balance-Leahy Scale: Fair ?  ?  ?Standing balance support: Bilateral upper extremity supported, During functional activity, Reliant on assistive device for balance ?Standing balance-Leahy Scale: Poor ?  ?  ?  ?  ?  ?  ?  ?  ?  ?  ?  ?  ?   ? ?ADL either performed or assessed with clinical judgement  ? ?ADL Overall ADL's : Needs assistance/impaired ?Eating/Feeding: Set up;Sitting ?  ?Grooming: Wash/dry hands;Standing;Min guard ?Grooming Details (indicate cue type and reason): standing at sink ?Upper Body Bathing: Minimal assistance;Sitting ?  ?Lower Body Bathing: Moderate assistance;Sit to/from stand;Sitting/lateral leans ?  ?Upper Body Dressing : Minimal assistance;Sitting ?  ?Lower Body Dressing: Moderate assistance;Sit to/from stand;Sitting/lateral leans ?Lower Body Dressing Details (indicate cue type and reason): attempt to doff sock, limited by pain ?Toilet Transfer: Min guard;Rolling walker (2 wheels);Ambulation;Regular Toilet ?  ?Toileting- Clothing Manipulation and Hygiene: Supervision/safety;Sitting/lateral lean;Sit to/from stand ?Toileting - Clothing Manipulation Details (indicate cue type and reason): for pericare ?  ?  ?Functional mobility during ADLs: Min guard;Rolling walker (2 wheels) ?   ? ? ? ?Vision   ?Vision Assessment?: No apparent visual deficits  ?   ?Perception   ?  ?Praxis   ?  ? ?  Pertinent Vitals/Pain Pain Assessment ?Pain Assessment: Faces ?Pain Score: 4  ?Faces Pain Scale: Hurts little  more ?Pain Location: L hip ?Pain Descriptors / Indicators: Grimacing, Operative site guarding, Discomfort ?Pain Intervention(s): Limited activity within patient's tolerance, Monitored during session, Repositioned  ? ? ? ?Hand Dominance   ?  ?Extremity/Trunk Assessment Upper Extremity Assessment ?Upper Extremity Assessment: Generalized weakness ?  ?Lower Extremity Assessment ?Lower Extremity Assessment: Defer to PT evaluation ?LLE Deficits / Details: hip fx s/p IM nail, hip ROM limited by pain ?  ?  ?  ?Communication Communication ?Communication: No difficulties ?  ?Cognition Arousal/Alertness: Awake/alert ?Behavior During Therapy: [facility] for tasks assessed/performed ?Overall Cognitive Status: History of cognitive impairments - at baseline ?Area of Impairment: Safety/judgement, Memory, Problem solving, Attention ?  ?  ?  ?  ?  ?  ?  ?  ?  ?  ?Memory: Decreased short-term memory ?  ?Safety/Judgement: Decreased awareness of safety ?  ?Problem Solving: Difficulty sequencing, Requires verbal cues ?  ?  ?  ?General Comments  soft BP after toilet transfer, pt reporting dizziness, WNL when taken in supine, RN aware. DIL present throughout session ? ?  ?Exercises   ?  ?Shoulder Instructions    ? ? ?Home Living Family/patient expects to be discharged to:: Private residence ?Living Arrangements: Spouse/significant other ?Available Help at Discharge: Family;Available 24 hours/day ?Type of Home: House ?Home Access: Stairs to enter ?Entrance Stairs-Number of Steps: 2 ?Entrance Stairs-Rails: None ?Home Layout: Two level;Bed/bath upstairs ?Alternate Level Stairs-Number of Steps: flight ?  ?Bathroom Shower/Tub: Walk-in shower ?  ?Bathroom Toilet: Standard ?  ?  ?Home Equipment: None ?  ?  ?  ? ?  ?Prior Functioning/Environment Prior Level of Function : Independent/Modified Independent ?  ?  ?  ?  ?  ?  ?Mobility Comments: no AD ?ADLs Comments: does IADLs ?  ? ?  ?  ?OT Problem List: Decreased strength;Decreased range of  motion;Decreased activity tolerance;Impaired balance (sitting and/or standing);Decreased cognition;Decreased knowledge of use of DME or AE;Decreased safety awareness ?  ?   ?OT Treatment/Interventions: Self-care/ADL training;Therapeutic exercise;DME and/or AE instruction;Therapeutic activities;Patient/family education;Balance training  ?  ?OT Goals(Current goals can be found in the care plan section) Acute Rehab OT Goals ?Patient Stated Goal: none stated ?OT Goal Formulation: With patient ?Time For Goal Achievement: 08/17/21 ?Potential to Achieve Goals: Good  ?OT Frequency: Min 3X/week ?  ? ?Co-evaluation   ?  ?  ?  ?  ? ?  ?AM-PAC OT "6 Clicks" Daily Activity     ?Outcome Measure Help from another person eating meals?: None ?Help from another person taking care of personal grooming?: A Little ?Help from another person toileting, which includes using toliet, bedpan, or urinal?: A Little ?Help from another person bathing (including washing, rinsing, drying)?: A Lot ?Help from another person to put on and taking off regular upper body clothing?: A Little ?Help from another person to put on and taking off regular lower body clothing?: A Lot ?6 Click Score: 17 ?  ?End of Session Equipment Utilized During Treatment: Gait belt;Rolling walker (2 wheels) ?Nurse Communication: Mobility status;Other (comment) (notified of soft BP) ? ?Activity Tolerance: Patient tolerated treatment well ?Patient left: in bed;with call bell/phone within reach;with bed alarm set;with family/visitor present;with SCD's reapplied ? ?OT Visit Diagnosis: Unsteadiness on feet (R26.81);Other abnormalities of gait and mobility (R26.89);Muscle weakness (generalized) (M62.81)  ?              ?Time: 3810-1751 ?OT Time Calculation (min): 32  min ?Charges:  OT General Charges ?$OT Visit: 1 Visit ?OT Evaluation ?$OT Eval Moderate Complexity: 1 Mod ?OT Treatments ?$Self Care/Home Management : 8-22 mins ? ?Lynnda Child, OTD, OTR/L ?Acute Rehab ?[phone] ? ? ?Kaylyn Lim ?08/03/2021, 4:36 PM ?

## 2021-08-03 NOTE — Progress Notes (Signed)
? ? ? [facility] ?                                                                            ? ? ?Laura Maxwell, is a 86 y.o. female, DOB - [birth year], RSW:[id number] ?Admit date - 08/01/2021    ?Outpatient Primary MD for the patient is Shon Baton, MD ? ?LOS - 1  days ? ? ? ?Brief summary  ? ?Laura Maxwell is a 86 y.o. female with medical history significant for type 2 diabetes mellitus, hypertension, hyperlipidemia who is admitted to [facility] on 08/01/2021 with acute left intratrochanteric hip fracture after presenting from home to [facility] ED complaining of left hip pain.  ?  ? ? ?Assessment & Plan  ? ? ?Assessment and Plan: ?* Closed left hip fracture ([place]) ?From a mechanical fall.  ?Orthopedics consulted and pt underwent surgical repair.  ?Pain control and therapy evaluations .  ? ? ?Leukocytosis ?? From UTI ?Urine cultures are pending.  ?On rocephin.  ? ? ?Hyperlipidemia ?Resume statin on discharge.  ? ?Hypertension;  ?Sub optimally controlled, restarted home meds.  ? ? ?Diabetes mellitus without complication ([place]) with hyperglycemia.  ?CBG (last 3)  ?Recent Labs  ?  08/03/21 ?0938 08/03/21 ?1829 08/03/21 ?1151  ?GLUCAP 147* 129* 244*  ? ?Non insulin dependent.  ?Resume metformin .  ?Hemoglobin A1c is 8.8. ? ? ?  ? ?Estimated body mass index is 27.51 kg/m? as calculated from the following: ?  Height as of this encounter: '5\' 5"'$  (1.651 m). ?  Weight as of this encounter: 75 kg. ? ?Code Status: full code.  ?DVT Prophylaxis:  SCDs Start: 08/02/21 1512 ?SCDs Start: 08/02/21 0111 ? ? ?Level of Care: Level of care: Telemetry Medical ?Family Communication: Updated patient's family at bedside.  ? ?Disposition Plan:     Remains inpatient appropriate:  Therapy eval.  ? ?Procedures:  ?INTRAMEDULLARY (IM) NAIL INTERTROCHANTRIC on 5//6/23 ? ?Consultants:   ?Dr Percell Miller with orthopedics.  ?Antimicrobials:  ? ?Anti-infectives (From admission, onward)  ? ? Start     Dose/Rate Route Frequency Ordered Stop  ?  08/02/21 1030  ceFAZolin (ANCEF) IVPB 2g/100 mL premix       ? 2 g ?200 mL/hr over 30 Minutes Intravenous On call to O.R. 08/02/21 1022 08/02/21 1133  ? 08/02/21 0928  cefTRIAXone (ROCEPHIN) 1 g in sodium chloride 0.9 % 100 mL IVPB       ? 1 g ?200 mL/hr over 30 Minutes Intravenous Every 24 hours 08/02/21 0923    ? ?  ? ? ? ?Medications ? ?Scheduled Meds: ? acetaminophen  500 mg Oral Q6H  ? aspirin EC  81 mg Oral BID  ? docusate sodium  100 mg Oral BID  ? ferrous sulfate  325 mg Oral TID PC  ? insulin aspart  0-9 Units Subcutaneous Q6H  ? ?Continuous Infusions: ? sodium chloride 75 mL/hr at 08/02/21 1545  ? cefTRIAXone (ROCEPHIN)  IV 1 g (08/03/21 0824)  ? ?PRN Meds:.acetaminophen, alum & mag hydroxide-simeth, bisacodyl, HYDROcodone-acetaminophen, HYDROcodone-acetaminophen, menthol-cetylpyridinium **OR** phenol, morphine injection, naLOXone (NARCAN)  injection, ondansetron (ZOFRAN) IV, polyethylene glycol, sodium phosphate ? ? ? ?Subjective:  ? ?Laura Maxwell was seen and examined  today.  BM earlier this am.  ?No chest pain .  ?Objective:  ? ?Vitals:  ? 08/02/21 1440 08/02/21 2014 08/03/21 0441 08/03/21 0848  ?BP: (!) 178/61 (!) 168/70 139/71 (!) 166/83  ?Pulse: 71 77 77 78  ?Resp: '17 19 20 18  '$ ?Temp: 97.9 ?F (36.6 ?C) 98.1 ?F (36.7 ?C) 98.5 ?F (36.9 ?C) 98 ?F (36.7 ?C)  ?TempSrc:  Oral Oral   ?SpO2: 98% 100% 100% 97%  ?Weight:      ?Height:      ? ? ?Intake/Output Summary (Last 24 hours) at 08/03/2021 0953 ?Last data filed at 08/03/2021 7711 ?Gross per 24 hour  ?Intake 1107.77 ml  ?Output 725 ml  ?Net 382.77 ml  ? ?Filed Weights  ? 08/01/21 2201 08/02/21 1000  ?Weight: 75 kg 75 kg  ? ? ? ?Exam ?General: Alert and oriented x 3, NAD ?Cardiovascular: S1 S2 auscultated, no murmurs, RRR ?Respiratory: Clear to auscultation bilaterally, no wheezing, rales or rhonchi ?Gastrointestinal: Soft, nontender, nondistended, + bowel sounds ?Ext: no pedal edema bilaterally ?Neuro: AAOx3, Cr N's II- XII. Strength 5/5 upper and lower  extremities bilaterally ?Skin: No rashes ?Psych: Normal affect and demeanor, alert and oriented x3  ? ? ?Data Reviewed:  I have personally reviewed following labs and imaging studies ? ? ?CBC ?Lab Results  ?Component Value Date  ? WBC 11.7 (H) 08/02/2021  ? RBC 4.70 08/02/2021  ? HGB 15.0 08/02/2021  ? HCT 44.6 08/02/2021  ? MCV 94.9 08/02/2021  ? MCH 31.9 08/02/2021  ? PLT 215 08/02/2021  ? MCHC 33.6 08/02/2021  ? RDW 12.5 08/02/2021  ? LYMPHSABS 1.1 08/02/2021  ? MONOABS 1.6 (H) 08/02/2021  ? EOSABS 0.1 08/02/2021  ? BASOSABS 0.1 08/02/2021  ? ? ? ?Last metabolic panel ?Lab Results  ?Component Value Date  ? NA 139 08/02/2021  ? K 3.7 08/02/2021  ? CL 107 08/02/2021  ? CO2 22 08/02/2021  ? BUN 13 08/02/2021  ? CREATININE 0.75 08/02/2021  ? GLUCOSE 235 (H) 08/02/2021  ? GFRNONAA >60 08/02/2021  ? GFRAA 77 01/29/2020  ? CALCIUM 9.5 08/02/2021  ? PROT 6.0 (L) 08/02/2021  ? ALBUMIN 3.7 08/02/2021  ? LABGLOB 2.0 01/29/2020  ? AGRATIO 2.2 01/29/2020  ? BILITOT 1.0 08/02/2021  ? ALKPHOS 83 08/02/2021  ? AST 12 (L) 08/02/2021  ? ALT 15 08/02/2021  ? ANIONGAP 10 08/02/2021  ? ? ?CBG (last 3)  ?Recent Labs  ?  08/02/21 ?1911 08/03/21 ?6579 08/03/21 ?0383  ?GLUCAP 190* 147* 129*  ?  ? ? ?Coagulation Profile: ?Recent Labs  ?Lab 08/01/21 ?2338  ?INR 0.9  ? ? ? ?Radiology Studies: ?DG Chest Port 1 View ? ?Result Date: 08/01/2021 ?CLINICAL DATA:  Preop hip fracture EXAM: PORTABLE CHEST 1 VIEW COMPARISON:  09/16/2018 FINDINGS: No focal opacity or pleural effusion. Stable cardiomediastinal silhouette with aortic atherosclerosis. No pneumothorax. IMPRESSION: No active disease. Electronically Signed   By: Donavan Foil M.D.   On: 08/01/2021 23:56  ? ?DG C-Arm 1-60 Min-No Report ? ?Result Date: 08/02/2021 ?Fluoroscopy was utilized by the requesting physician.  No radiographic interpretation.  ? ?DG Hip Port Unilat With Pelvis 1V Left ? ?Result Date: 08/02/2021 ?CLINICAL DATA:  Post open reduction EXAM: DG HIP (WITH OR WITHOUT PELVIS) 1V  PORT LEFT COMPARISON:  None Available. FINDINGS: Interval postsurgical changes left femoral intramedullary nail placement. Hardware components appear in their expected alignment. Left hip intertrochanteric fracture again seen. No new periprostatic fracture. Expected postoperative changes within the overlying soft tissues. IMPRESSION:  Expected postsurgical changes from left femoral intramedullary nail placement for intratrochanteric fracture. Electronically Signed   By: Yetta Glassman M.D.   On: 08/02/2021 15:04  ? ?DG Hip Unilat W or Wo Pelvis 2-3 Views Left ? ?Result Date: 08/01/2021 ?CLINICAL DATA:  Left hip pain. EXAM: DG HIP (WITH OR WITHOUT PELVIS) 2-3V LEFT FINDINGS: There is an acute left femoral intratrochanteric fracture. Fracture fragments are distracted 1 cm. There is no dislocation. The bones are mildly osteopenic. Soft tissues are within normal limits. IMPRESSION: Acute left femoral intratrochanteric fracture. Electronically Signed   By: Ronney Asters M.D.   On: 08/01/2021 23:03  ? ?DG FEMUR MIN 2 VIEWS LEFT ? ?Result Date: 08/02/2021 ?CLINICAL DATA:  ORIF LEFT hip fracture. EXAM: LEFT FEMUR 2 VIEWS COMPARISON:  Prior studies FINDINGS: Intraoperative spot views of the LEFT hip and distal LEFT femur submitted postoperatively for interpretation. Internal nail/screw within the LEFT femur identified traversing an intertrochanteric fracture which appears in near anatomic alignment and position on this study. No gross complicating features noted. IMPRESSION: ORIF LEFT hip fracture. Electronically Signed   By: Margarette Canada M.D.   On: 08/02/2021 12:16   ? ? ? ? ?Hosie Poisson M.D. ?[facility] ?08/03/2021, 9:53 AM ? ?Available via Epic secure chat 7am-7pm ?After 7 pm, please refer to night coverage provider listed on amion. ? ? ? ?

## 2021-08-03 NOTE — Evaluation (Signed)
Physical Therapy Evaluation ?Patient Details ?Name: Laura Maxwell [facility] ?MRN: [id number] ?DOB: 12-20-1935 ?Today's Date: 08/03/2021 ? ?History of Present Illness ? Pt is an 86 y.o. female who fell at home sustaining L hip fx. She underwent IM nailing 5/6. PMH: type 2 diabetes mellitus, hypertension, hyperlipidemia ?  ?Clinical Impression ? Pt admitted with above diagnosis. PTA pt lived at home with her husband, independent with mobility/ADLs. Pt currently with functional limitations due to the deficits listed below (see PT Problem List). On eval, pt required mod assist bed mobility, mod assist sit to stand, and min assist amb 20' with RW. Pt will benefit from skilled PT to increase their independence and safety with mobility to allow discharge to the venue listed below.   ?   ?   ? ?Recommendations for follow up therapy are one component of a multi-disciplinary discharge planning process, led by the attending physician.  Recommendations may be updated based on patient status, additional functional criteria and insurance authorization. ? ?Follow Up Recommendations Acute inpatient rehab (3hours/day) ? ?  ?Assistance Recommended at Discharge Frequent or constant Supervision/Assistance  ?Patient can return home with the following ? A little help with walking and/or transfers;Assistance with cooking/housework;Direct supervision/assist for medications management;Assist for transportation;A lot of help with bathing/dressing/bathroom;Help with stairs or ramp for entrance ? ?  ?Equipment Recommendations Rolling walker (2 wheels)  ?Recommendations for Other Services ? Rehab consult  ?  ?Functional Status Assessment Patient has had a recent decline in their functional status and demonstrates the ability to make significant improvements in function in a reasonable and predictable amount of time.  ? ?  ?Precautions / Restrictions Precautions ?Precautions: Fall ?Restrictions ?Weight Bearing Restrictions: Yes ?LLE Weight Bearing: Weight  bearing as tolerated  ? ?  ? ?Mobility ? Bed Mobility ?Overal bed mobility: Needs Assistance ?Bed Mobility: Supine to Sit ?  ?  ?Supine to sit: Mod assist, HOB elevated ?  ?  ?General bed mobility comments: +rail, cues for sequencing, assist with LLE and trunk, use of bed pad to scoot to EOB ?  ? ?Transfers ?Overall transfer level: Needs assistance ?Equipment used: Rolling walker (2 wheels) ?Transfers: Sit to/from Stand ?Sit to Stand: Mod assist ?  ?  ?  ?  ?  ?General transfer comment: cues for hand placement, increased time, assist to power up and control descent ?  ? ?Ambulation/Gait ?Ambulation/Gait assistance: Min assist ?Gait Distance (Feet): 20 Feet ?Assistive device: Rolling walker (2 wheels) ?Gait Pattern/deviations: Step-to pattern, Step-through pattern, Decreased stride length, Decreased weight shift to left, Antalgic ?  ?Gait velocity interpretation: <1.8 ft/sec, indicate of risk for recurrent falls ?  ?General Gait Details: cues for RW management and sequencing ? ?Stairs ?  ?  ?  ?  ?  ? ?Wheelchair Mobility ?  ? ?Modified Rankin (Stroke Patients Only) ?  ? ?  ? ?Balance Overall balance assessment: Needs assistance ?Sitting-balance support: Feet supported, No upper extremity supported ?Sitting balance-Leahy Scale: Fair ?  ?  ?Standing balance support: Bilateral upper extremity supported, During functional activity, Reliant on assistive device for balance ?Standing balance-Leahy Scale: Poor ?  ?  ?  ?  ?  ?  ?  ?  ?  ?  ?  ?  ?   ? ? ? ?Pertinent Vitals/Pain Pain Assessment ?Pain Assessment: Faces ?Faces Pain Scale: Hurts little more ?Pain Location: L hip ?Pain Descriptors / Indicators: Grimacing, Operative site guarding, Discomfort ?Pain Intervention(s): Monitored during session, Repositioned, Premedicated before session  ? ? ?Home  Living Family/patient expects to be discharged to:: Private residence ?Living Arrangements: Spouse/significant other ?Available Help at Discharge: Family;Available 24  hours/day ?Type of Home: House ?Home Access: Stairs to enter ?Entrance Stairs-Rails: None ?Entrance Stairs-Number of Steps: 2 ?Alternate Level Stairs-Number of Steps: flight ?Home Layout: Two level;Bed/bath upstairs ?Home Equipment: None ?   ?  ?Prior Function Prior Level of Function : Independent/Modified Independent ?  ?  ?  ?  ?  ?  ?  ?  ?  ? ? ?Hand Dominance  ?   ? ?  ?Extremity/Trunk Assessment  ? Upper Extremity Assessment ?Upper Extremity Assessment: Defer to OT evaluation ?  ? ?Lower Extremity Assessment ?Lower Extremity Assessment: LLE deficits/detail ?LLE Deficits / Details: hip fx s/p IM nail, hip ROM limited by pain ?  ? ?   ?Communication  ? Communication: No difficulties  ?Cognition Arousal/Alertness: Awake/alert ?Behavior During Therapy: [facility] for tasks assessed/performed ?Overall Cognitive Status: History of cognitive impairments - at baseline ?Area of Impairment: Safety/judgement, Memory, Problem solving, Attention ?  ?  ?  ?  ?  ?  ?  ?  ?  ?Current Attention Level: Selective ?Memory: Decreased short-term memory ?  ?Safety/Judgement: Decreased awareness of safety, Decreased awareness of deficits ?  ?Problem Solving: Difficulty sequencing, Requires verbal cues ?General Comments: DIL present in room and reports dementia at baseline in pt and pt's spouse. ?  ?  ? ?  ?General Comments   ? ?  ?Exercises General Exercises - Lower Extremity ?Ankle Circles/Pumps: AROM, Both, 10 reps, Supine  ? ?Assessment/Plan  ?  ?PT Assessment Patient needs continued PT services  ?PT Problem List Decreased strength;Decreased mobility;Decreased safety awareness;Decreased knowledge of precautions;Decreased activity tolerance;Decreased cognition;Pain;Decreased balance;Decreased knowledge of use of DME ? ?   ?  ?PT Treatment Interventions DME instruction;Therapeutic activities;Gait training;Therapeutic exercise;Patient/family education;Balance training;Stair training;Functional mobility training   ? ?PT Goals (Current goals  can be found in the Care Plan section)  ?Acute Rehab PT Goals ?Patient Stated Goal: home ?PT Goal Formulation: With patient/family ?Time For Goal Achievement: 08/17/21 ?Potential to Achieve Goals: Good ? ?  ?Frequency Min 3X/week ?  ? ? ?Co-evaluation   ?  ?  ?  ?  ? ? ?  ?AM-PAC PT "6 Clicks" Mobility  ?Outcome Measure Help needed turning from your back to your side while in a flat bed without using bedrails?: A Little ?Help needed moving from lying on your back to sitting on the side of a flat bed without using bedrails?: A Lot ?Help needed moving to and from a bed to a chair (including a wheelchair)?: A Lot ?Help needed standing up from a chair using your arms (e.g., wheelchair or bedside chair)?: A Lot ?Help needed to walk in hospital room?: A Little ?Help needed climbing 3-5 steps with a railing? : Total ?6 Click Score: 13 ? ?  ?End of Session Equipment Utilized During Treatment: Gait belt ?Activity Tolerance: Patient tolerated treatment well ?Patient left: in chair;with call bell/phone within reach;with family/visitor present ?Nurse Communication: Mobility status ?PT Visit Diagnosis: Other abnormalities of gait and mobility (R26.89);Pain ?Pain - Right/Left: Left ?Pain - part of body: Hip ?  ? ?Time: 4270-6237 ?PT Time Calculation (min) (ACUTE ONLY): 32 min ? ? ?Charges:   PT Evaluation ?$PT Eval Moderate Complexity: 1 Mod ?PT Treatments ?$Gait Training: 8-22 mins ?  ?   ? ? ?Lorrin Goodell, PT  ?Office # [phone] ?Pager [phone] ? ? ?Lorriane Shire ?08/03/2021, 1:59 PM ? ?

## 2021-08-04 ENCOUNTER — Encounter (HOSPITAL_COMMUNITY): Payer: Self-pay | Admitting: Orthopedic Surgery

## 2021-08-04 DIAGNOSIS — E119 Type 2 diabetes mellitus without complications: Secondary | ICD-10-CM | POA: Diagnosis not present

## 2021-08-04 DIAGNOSIS — S72002D Fracture of unspecified part of neck of left femur, subsequent encounter for closed fracture with routine healing: Secondary | ICD-10-CM | POA: Diagnosis not present

## 2021-08-04 DIAGNOSIS — I10 Essential (primary) hypertension: Secondary | ICD-10-CM | POA: Diagnosis not present

## 2021-08-04 LAB — GLUCOSE, CAPILLARY
Glucose-Capillary: 131 mg/dL — ABNORMAL HIGH (ref 70–99)
Glucose-Capillary: 227 mg/dL — ABNORMAL HIGH (ref 70–99)

## 2021-08-04 LAB — HEMOGLOBIN AND HEMATOCRIT, BLOOD
HCT: 40.7 % (ref 36.0–46.0)
Hemoglobin: 13.2 g/dL (ref 12.0–15.0)

## 2021-08-04 NOTE — Progress Notes (Signed)
Occupational Therapy Treatment ?Patient Details ?Name: Laura Maxwell [facility] ?MRN: [id number] ?DOB: 1935/05/05 ?Today's Date: 08/04/2021 ? ? ?History of present illness Pt is an 86 y.o. female who fell at home sustaining L hip fx. She underwent IM nailing 5/6. PMH: type 2 diabetes mellitus, hypertension, hyperlipidemia ?  ?OT comments ? This 86 yo female seen today to look at LB ADLs and need/no need for AE for LB ADLs--feel she will progress with follow up rehab at SNF to point she will not need it so did not show it to her. She was able to get up from bed with min A for LLE only, ambulate a short distance (10 feet for and back) with RW and do some exercises with LLE while seated at EOB that will A her with getting back to being able to do LB ADLs without the AE. We will continue to follow.  ? ?Recommendations for follow up therapy are one component of a multi-disciplinary discharge planning process, led by the attending physician.  Recommendations may be updated based on patient status, additional functional criteria and insurance authorization. ?   ?Follow Up Recommendations ? Skilled nursing-short term rehab (<3 hours/day)  ?  ?Assistance Recommended at Discharge Frequent or constant Supervision/Assistance  ?Patient can return home with the following ? A little help with walking and/or transfers;A little help with bathing/dressing/bathroom;Assistance with cooking/housework;Assist for transportation;Help with stairs or ramp for entrance;Direct supervision/assist for financial management;Direct supervision/assist for medications management ?  ?Equipment Recommendations ? BSC/3in1  ?  ?   ?Precautions / Restrictions Precautions ?Precautions: Fall ?Restrictions ?Weight Bearing Restrictions: No ?LLE Weight Bearing: Weight bearing as tolerated  ? ? ?  ? ?Mobility Bed Mobility ?Overal bed mobility: Needs Assistance ?Bed Mobility: Sit to Supine ?  ?  ?Supine to sit: Min assist, HOB elevated ?  ?  ?General bed mobility comments:  needed A to initate LLE movement off of bed ?  ? ?Transfers ?Overall transfer level: Needs assistance ?Equipment used: Rolling walker (2 wheels) ?Transfers: Sit to/from Stand ?Sit to Stand: Min guard ?  ?  ?  ?  ?  ?General transfer comment: cues for hand placement, increased time, assist to power up and control descent ?  ?  ?Balance Overall balance assessment: Needs assistance ?Sitting-balance support: No upper extremity supported, Feet supported ?Sitting balance-Leahy Scale: Good ?  ?  ?Standing balance support: Bilateral upper extremity supported ?Standing balance-Leahy Scale: Poor ?  ?  ?  ?  ?  ?  ?  ?  ?  ?  ?  ?  ?   ? ?ADL either performed or assessed with clinical judgement  ? ?ADL Overall ADL's : Needs assistance/impaired ?  ?  ?  ?  ?  ?  ?  ?  ?  ?  ?Lower Body Dressing: Min guard;Sit to/from stand ?Lower Body Dressing Details (indicate cue type and reason): Pt can doff and donn her right sock; she can reach to the top of her left sock but not to toes yet, she can lift foot off of ground but not enough yet to be truly functional with LB ADLs. We did discuss the option of AE for LBD but I also feel that by the time she finishes her rehab at SNF she will to need it. ?Toilet Transfer: Min guard;Rolling walker (2 wheels);Ambulation ?Toilet Transfer Details (indicate cue type and reason): simulated bed>couch DIL sitting on, backing up and turning same distance to sit in recliner ?Toileting- Water quality scientist and Hygiene: Min  guard;Sit to/from stand ?  ?  ?  ?  ?  ?  ? ?Extremity/Trunk Assessment Upper Extremity Assessment ?Upper Extremity Assessment: Overall WFL for tasks assessed ?  ?  ?  ?  ?  ? ?Vision Patient Visual Report: No change from baseline ?  ?  ?   ?   ? ?Cognition Arousal/Alertness: Awake/alert ?Behavior During Therapy: [facility] for tasks assessed/performed ?Overall Cognitive Status: History of cognitive impairments - at baseline ?Area of Impairment: Problem solving ?  ?  ?  ?  ?  ?  ?  ?  ?  ?   ?  ?  ?  ?  ?Problem Solving: Requires verbal cues (intermittently) ?General Comments: DIL present in room and reports dementia at baseline in pt and pt's spouse. ?  ?  ?   ?   ?   ?   ? ? ?Pertinent Vitals/ Pain       Pain Assessment ?Pain Assessment: 0-10 ?Pain Score: 3  ?Pain Descriptors / Indicators: Sore, Discomfort ?Pain Intervention(s): Limited activity within patient's tolerance, Monitored during session, Repositioned ? ?   ?   ? ?Frequency ? Min 2X/week  ? ? ? ? ?  ?Progress Toward Goals ? ?OT Goals(current goals can now be found in the care plan section) ? Progress towards OT goals: Progressing toward goals ? ?Acute Rehab OT Goals ?OT Goal Formulation: With patient ?Time For Goal Achievement: 08/17/21 ?Potential to Achieve Goals: Good  ?Plan Discharge plan needs to be updated;Frequency needs to be updated   ? ?   ?AM-PAC OT "6 Clicks" Daily Activity     ?Outcome Measure ? ? Help from another person eating meals?: None ?Help from another person taking care of personal grooming?: A Little ?Help from another person toileting, which includes using toliet, bedpan, or urinal?: A Little ?Help from another person bathing (including washing, rinsing, drying)?: A Little ?Help from another person to put on and taking off regular upper body clothing?: A Little ?Help from another person to put on and taking off regular lower body clothing?: A Little ?6 Click Score: 19 ? ?  ?End of Session Equipment Utilized During Treatment: Gait belt;Rolling walker (2 wheels) ? ?OT Visit Diagnosis: Unsteadiness on feet (R26.81);Other abnormalities of gait and mobility (R26.89);Muscle weakness (generalized) (M62.81);Pain ?Pain - Right/Left: Left ?Pain - part of body: Leg ?  ?Activity Tolerance Patient tolerated treatment well ?  ?Patient Left in chair;with call bell/phone within reach;with family/visitor present ?  ?   ? ?   ? ?Time: 1136-1209 ?OT Time Calculation (min): 33 min ? ?Charges: OT General Charges ?$OT Visit: 1 Visit ?OT  Treatments ?$Self Care/Home Management : 23-37 mins ? ?Golden Circle, OTR/L ?Acute Rehab Services ?Pager [phone] ?Office [phone] ? ? ? ?Almon Register ?08/04/2021, 2:13 PM ?

## 2021-08-04 NOTE — Progress Notes (Signed)
? ? ?  RE:  Laura Maxwell       ?Date of Birth:  Apr 29, 2035     ?Date:  08/04/21      ? ? ?To Whom It May Concern: ? ?Please be advised that the above-named patient will require a short-term nursing home stay - anticipated 30 days or less for rehabilitation and strengthening.  The plan is for return home. ? ? ?              ?MD signature ? ?              ?Date ?

## 2021-08-04 NOTE — TOC CAGE-AID Note (Signed)
Transition of Care (TOC) - CAGE-AID Screening ? ? ?Patient Details  ?Name: Laura Maxwell [facility] ?MRN: [id number] ?Date of Birth: 1936/02/24 ? ?Transition of Care (TOC) CM/SW Contact:    ?Irja Wheless C Tarpley-Carter, LCSWA ?Phone Number: ?08/04/2021, 1:42 PM ? ? ?Clinical Narrative: ?Pt participated in [place].  Pt stated she does not use substance or ETOH.  Pt was not offered resources, due to no usage of substance or ETOH.    ? ?Passenger transport manager, MSW, LCSW-A ?Pronouns:  She/Her/Hers ?[facility] of [facility] ?Clinical Social Worker ?Direct Number:  [phone] ?[email or web address] ? ?CAGE-AID Screening: ?  ? ?Have You Ever Felt You Ought to Cut Down on Your Drinking or Drug Use?: No ?Have People Annoyed You By Critizing Your Drinking Or Drug Use?: No ?Have You Felt Bad Or Guilty About Your Drinking Or Drug Use?: No ?Have You Ever Had a Drink or Used Drugs First Thing In The Morning to Steady Your Nerves or to Get Rid of a Hangover?: No ?CAGE-AID Score: 0 ? ?Substance Abuse Education Offered: No ? ?  ? ? ? ? ? ? ?

## 2021-08-04 NOTE — [place] FL2 (Signed)
?[place] MEDICA[place] FL2 LEVEL OF CARE SCREENING TOOL  ?  ? ?IDENTIFICATION  ?Patient Name: ?Laura Maxwell [facility] Birthdate: March 01, 1936 Sex: female Admission Date (Current Location): ?08/01/2021  ?[place] and [place] Number: ? [place] ?  Facility and Address:  ?[facility] [place] [address], [place], [place] [address] ?     Provider Number: ?[id number]  ?Attending Physician Name and Address:  ?Hosie Poisson, MD ? Relative Name and Phone Number:  ?Bethena Midget Daughter   [phone] ?   ?Current Level of Care: ?Hospital Recommended Level of Care: ?[place] Prior Approval Number: ?  ? ?Date Approved/Denied: ?  PASRR Number: ?  ? ?Discharge Plan: ?SNF ?  ? ?Current Diagnoses: ?Patient Active Problem List  ? Diagnosis Date Noted  ? Closed left hip fracture ([place]) 08/02/2021  ? Diabetes mellitus without complication ([place])   ? Hyperlipidemia   ? Fall at home, initial encounter   ? Leukocytosis   ? Mild cognitive impairment with memory loss 01/29/2020  ? Angioedema 01/20/2013  ? HTN (hypertension) 01/20/2013  ? ? ?Orientation RESPIRATION BLADDER Height & Weight   ?  ?Self, Situation, Place ? Normal Continent, External catheter Weight: 165 lb 5.5 oz (75 kg) ?Height:  '5\' 5"'$  (165.1 cm)  ?BEHAVIORAL SYMPTOMS/MOOD NEUROLOGICAL BOWEL NUTRITION STATUS  ?    Continent Diet (see discharge summary)  ?AMBULATORY STATUS COMMUNICATION OF NEEDS Skin   ?Limited Assist Verbally Surgical wounds ?  ?  ?  ?    ?     ?     ? ? ?Personal Care Assistance Level of Assistance  ?Bathing, Feeding, Dressing Bathing Assistance: Limited assistance ?Feeding assistance: Limited assistance ?Dressing Assistance: Limited assistance ?   ? ?Functional Limitations Info  ?Sight, Hearing, Speech Sight Info: Adequate ?Hearing Info: Adequate ?Speech Info: Adequate  ? ? ?SPECIAL CARE FACTORS FREQUENCY  ?PT (By licensed PT), OT (By licensed OT)   ?  ?PT Frequency: 5x week ?OT Frequency: 5x week ?  ?  ?  ?   ? ? ?Contractures  Contractures Info: Not present  ? ? ?Additional Factors Info  ?Code Status, Allergies, Insulin Sliding Scale Code Status Info: full ?Allergies Info: Ace Inhibitors, Phenazopyridine ?  ?Insulin Sliding Scale Info: Novolog: 0-9 units q6.  See discharge summary. ?  ?   ? ?Current Medications (08/04/2021):  This is the current hospital active medication list ?Current Facility-Administered Medications  ?Medication Dose Route Frequency Provider Last Rate Last Admin  ? acetaminophen (TYLENOL) tablet 325-650 mg  325-650 mg Oral Q6H PRN Ventura Bruns, PA-C   650 mg at 08/03/21 1823  ? alum & mag hydroxide-simeth (MAALOX/MYLANTA) 200-200-20 MG/5ML suspension 30 mL  30 mL Oral Q4H PRN Merlene Pulling K, PA-C      ? amLODipine (NORVASC) tablet 5 mg  5 mg Oral QHS Hosie Poisson, MD   5 mg at 08/03/21 2238  ? aspirin EC tablet 81 mg  81 mg Oral BID Ventura Bruns, PA-C   81 mg at 08/04/21 6503  ? bisacodyl (DULCOLAX) suppository 10 mg  10 mg Rectal Daily PRN Merlene Pulling K, PA-C      ? cefTRIAXone (ROCEPHIN) 1 g in sodium chloride 0.9 % 100 mL IVPB  1 g Intravenous Q24H Merlene Pulling K, PA-C 200 mL/hr at 08/04/21 0920 1 g at 08/04/21 0920  ? docusate sodium (COLACE) capsule 100 mg  100 mg Oral BID Merlene Pulling K, PA-C   100 mg at 08/03/21 0818  ? donepezil (ARICEPT)  tablet 5 mg  5 mg Oral QHS Hosie Poisson, MD   5 mg at 08/03/21 2238  ? ferrous sulfate tablet 325 mg  325 mg Oral TID PC Brown, Blaine K, PA-C   325 mg at 05/28/29 4388  ? folic acid (FOLVITE) tablet 1 mg  1 mg Oral Daily Hosie Poisson, MD   1 mg at 08/04/21 8757  ? HYDROcodone-acetaminophen (NORCO) 7.5-325 MG per tablet 1 tablet  1 tablet Oral Q4H PRN Merlene Pulling K, PA-C   1 tablet at 08/03/21 2238  ? HYDROcodone-acetaminophen (NORCO/VICODIN) 5-325 MG per tablet 1 tablet  1 tablet Oral Q4H PRN Merlene Pulling K, PA-C   1 tablet at 08/03/21 1059  ? insulin aspart (novoLOG) injection 0-9 Units  0-9 Units Subcutaneous Q6H Merlene Pulling K, PA-C   3 Units at 08/03/21  1232  ? labetalol (NORMODYNE) tablet 300 mg  300 mg Oral BID Hosie Poisson, MD   300 mg at 08/04/21 0917  ? menthol-cetylpyridinium (CEPACOL) lozenge 3 mg  1 lozenge Oral PRN Ventura Bruns, PA-C      ? Or  ? phenol (CHLORASEPTIC) mouth spray 1 spray  1 spray Mouth/Throat PRN Merlene Pulling K, PA-C      ? metFORMIN (GLUCOPHAGE) tablet 500 mg  500 mg Oral Q breakfast Hosie Poisson, MD   500 mg at 08/04/21 9728  ? morphine (PF) 2 MG/ML injection 0.5-1 mg  0.5-1 mg Intravenous Q2H PRN Merlene Pulling K, PA-C      ? naloxone [facility]) injection 0.4 mg  0.4 mg Intravenous PRN Howerter, Justin B, DO      ? ondansetron (ZOFRAN) injection 4 mg  4 mg Intravenous Q6H PRN Merlene Pulling K, PA-C   4 mg at 08/02/21 1150  ? polyethylene glycol (MIRALAX / GLYCOLAX) packet 17 g  17 g Oral Daily PRN Merlene Pulling K, PA-C      ? sodium phosphate (FLEET) 7-19 GM/118ML enema 1 enema  1 enema Rectal Once PRN Ventura Bruns, PA-C      ? ? ? ?Discharge Medications: ?Please see discharge summary for a list of discharge medications. ? ?Relevant Imaging Results: ? ?Relevant Lab Results: ? ? ?Additional Information ?SSN: [id number], pt is vaccinated for covid with 2 boosters. ? ?Joanne Chars, LCSW ? ? ? ? ?

## 2021-08-04 NOTE — Progress Notes (Signed)
Inpatient Rehab Admissions Coordinator:  ? ?Consult received and chart reviewed.  [employer] will not approve diagnosis of hip fracture for CIR.  Will need to seek other rehab venues.  ? ?Shann Medal, PT, DPT ?Admissions Coordinator ?[phone] ?08/04/21  ?9:31 AM ? ?

## 2021-08-04 NOTE — Progress Notes (Signed)
? ? ? ?Subjective: ?2 Days Post-Op s/p Procedure(s): ?INTRAMEDULLARY (IM) NAIL INTERTROCHANTRIC ? ? Patient is alert, oriented. ?Patient reports pain as mild at rest, more severe with weight bearing.  ?Denies chest pain, SOB, Calf pain. No nausea/vomiting. No other complaints. ?  ? ?Objective:  ?PE: ?VITALS:   ?Vitals:  ? 08/03/21 1541 08/03/21 2104 08/04/21 0502 08/04/21 0816  ?BP: 119/61 (!) 135/59 128/60 138/73  ?Pulse: 71 79 88 79  ?Resp: '18 18 18 17  '$ ?Temp: (!) 97.3 ?F (36.3 ?C) 97.9 ?F (36.6 ?C) 97.8 ?F (36.6 ?C) 97.6 ?F (36.4 ?C)  ?TempSrc: Oral Oral Oral Oral  ?SpO2: 97% 98% 98% 95%  ?Weight:      ?Height:      ? ? ?ABD soft ?Sensation intact distally ?Intact pulses distally ?Dorsiflexion/Plantar flexion intact ?Incision: dressing C/D/I ? ?LABS ? ?Results for orders placed or performed during the hospital encounter of 08/01/21 (from the past 24 hour(s))  ?CBC with Differential/Platelet     Status: Abnormal  ? Collection Time: 08/03/21 10:01 AM  ?Result Value Ref Range  ? WBC 11.6 (H) 4.0 - 10.5 K/uL  ? RBC 4.54 3.87 - 5.11 MIL/uL  ? Hemoglobin 14.4 12.0 - 15.0 g/dL  ? HCT 43.8 36.0 - 46.0 %  ? MCV 96.5 80.0 - 100.0 fL  ? MCH 31.7 26.0 - 34.0 pg  ? MCHC 32.9 30.0 - 36.0 g/dL  ? RDW 12.7 11.5 - 15.5 %  ? Platelets 196 150 - 400 K/uL  ? nRBC 0.0 0.0 - 0.2 %  ? Neutrophils Relative % 71 %  ? Neutro Abs 8.3 (H) 1.7 - 7.7 K/uL  ? Lymphocytes Relative 11 %  ? Lymphs Abs 1.2 0.7 - 4.0 K/uL  ? Monocytes Relative 15 %  ? Monocytes Absolute 1.7 (H) 0.1 - 1.0 K/uL  ? Eosinophils Relative 1 %  ? Eosinophils Absolute 0.1 0.0 - 0.5 K/uL  ? Basophils Relative 1 %  ? Basophils Absolute 0.1 0.0 - 0.1 K/uL  ? Immature Granulocytes 1 %  ? Abs Immature Granulocytes 0.09 (H) 0.00 - 0.07 K/uL  ?Basic metabolic panel     Status: Abnormal  ? Collection Time: 08/03/21 10:01 AM  ?Result Value Ref Range  ? Sodium 138 135 - 145 mmol/L  ? Potassium 4.1 3.5 - 5.1 mmol/L  ? Chloride 104 98 - 111 mmol/L  ? CO2 25 22 - 32 mmol/L  ? Glucose,  Bld 149 (H) 70 - 99 mg/dL  ? BUN 20 8 - 23 mg/dL  ? Creatinine, Ser 0.81 0.44 - 1.00 mg/dL  ? Calcium 9.4 8.9 - 10.3 mg/dL  ? GFR, Estimated >60 >60 mL/min  ? Anion gap 9 5 - 15  ?Glucose, capillary     Status: Abnormal  ? Collection Time: 08/03/21 11:51 AM  ?Result Value Ref Range  ? Glucose-Capillary 244 (H) 70 - 99 mg/dL  ?Glucose, capillary     Status: Abnormal  ? Collection Time: 08/03/21 11:57 PM  ?Result Value Ref Range  ? Glucose-Capillary 148 (H) 70 - 99 mg/dL  ?Glucose, capillary     Status: Abnormal  ? Collection Time: 08/04/21  5:52 AM  ?Result Value Ref Range  ? Glucose-Capillary 131 (H) 70 - 99 mg/dL  ? ? ?DG C-Arm 1-60 Min-No Report ? ?Result Date: 08/02/2021 ?Fluoroscopy was utilized by the requesting physician.  No radiographic interpretation.  ? ?DG Hip Port Unilat With Pelvis 1V Left ? ?Result Date: 08/02/2021 ?CLINICAL DATA:  Post open reduction EXAM:  DG HIP (WITH OR WITHOUT PELVIS) 1V PORT LEFT COMPARISON:  None Available. FINDINGS: Interval postsurgical changes left femoral intramedullary nail placement. Hardware components appear in their expected alignment. Left hip intertrochanteric fracture again seen. No new periprostatic fracture. Expected postoperative changes within the overlying soft tissues. IMPRESSION: Expected postsurgical changes from left femoral intramedullary nail placement for intratrochanteric fracture. Electronically Signed   By: Yetta Glassman M.D.   On: 08/02/2021 15:04  ? ?DG FEMUR MIN 2 VIEWS LEFT ? ?Result Date: 08/02/2021 ?CLINICAL DATA:  ORIF LEFT hip fracture. EXAM: LEFT FEMUR 2 VIEWS COMPARISON:  Prior studies FINDINGS: Intraoperative spot views of the LEFT hip and distal LEFT femur submitted postoperatively for interpretation. Internal nail/screw within the LEFT femur identified traversing an intertrochanteric fracture which appears in near anatomic alignment and position on this study. No gross complicating features noted. IMPRESSION: ORIF LEFT hip fracture.  Electronically Signed   By: Margarette Canada M.D.   On: 08/02/2021 12:16   ? ?Assessment/Plan: ?Principal Problem: ?  Closed left hip fracture ([place]) ?Active Problems: ?  HTN (hypertension) ?  Diabetes mellitus without complication ([place]) ?  Hyperlipidemia ?  Fall at home, initial encounter ?  Leukocytosis ? ? ? ?2 Days Post-Op s/p Procedure(s): ?INTRAMEDULLARY (IM) NAIL INTERTROCHANTRIC ? ?Weightbearing: WBAT LLE ?Insicional and dressing care: Dressings left intact until follow-up ?VTE prophylaxis: Aspirin '81mg'$  BID x 30 days ?Pain control: continue current regimen ?Follow - up plan: 2 weeks with Dr. Percell Miller ?Dispo: PT/OT recommending acute inpatient rehab, admission order placed ?Contact information:   ?Weekdays [address], [place] [phone] A ?fter hours and holidays please check [email or web address] for group call information for [facility] ? ?Ventura Bruns ?08/04/2021, 8:46 AM  ?

## 2021-08-04 NOTE — Progress Notes (Signed)
? ? ? [facility] ?                                                                            ? ? ?Laura Maxwell, is a 86 y.o. female, DOB - [birth year], HDQ:[id number] ?Admit date - 08/01/2021    ?Outpatient Primary MD for the patient is Shon Baton, MD ? ?LOS - 2  days ? ? ? ?Brief summary  ? ?Laura Maxwell is a 86 y.o. female with medical history significant for type 2 diabetes mellitus, hypertension, hyperlipidemia who is admitted to [facility] on 08/01/2021 with acute left intratrochanteric hip fracture after presenting from home to [facility] ED complaining of left hip pain.  ?  ? ? ?Assessment & Plan  ? ? ?Assessment and Plan: ?* Closed left hip fracture ([place]) ?From a mechanical fall.  ?Orthopedics consulted and pt underwent surgical repair.  ?Pain control and therapy evaluations .  ?Therapy eval recommending CIR, but unfortunately no approval. Will need to look for alternative disposition options.  ?Orthopedics recommend aspirin 81 mg BID for 30 days and pain meds as per orthopedics.  ?Outpatient follow up with orthopedics.  ? ? ?Leukocytosis ?? From UTI ?Urine cultures are show 1000 gram neg rods.  ?D/c rocephin after todays dose.  ? ? ?Hyperlipidemia ?Resume statin on discharge.  ? ?Hypertension;  ?Well controlled.  ? ? ?Diabetes mellitus without complication ([place]) with hyperglycemia.  ?CBG (last 3)  ?Recent Labs  ?  08/03/21 ?1151 08/03/21 ?2357 08/04/21 ?0552  ?GLUCAP 244* 148* 131*  ? ? ?Non insulin dependent.  ?Resume metformin .  ?Hemoglobin A1c is 8.8. ?No change in meds.  ? ?  ? ?Estimated body mass index is 27.51 kg/m? as calculated from the following: ?  Height as of this encounter: '5\' 5"'$  (1.651 m). ?  Weight as of this encounter: 75 kg. ? ?Code Status: full code.  ?DVT Prophylaxis:  SCDs Start: 08/02/21 1512 ?SCDs Start: 08/02/21 0111 ? ? ?Level of Care: Level of care: Telemetry Medical ?Family Communication: Updated patient's family at bedside.  ? ?Disposition Plan:     Remains inpatient  appropriate:  Therapy eval.  ? ?Procedures:  ?INTRAMEDULLARY (IM) NAIL INTERTROCHANTRIC on 5//6/23 ? ?Consultants:   ?Dr Percell Miller with orthopedics.  ?Antimicrobials:  ? ?Anti-infectives (From admission, onward)  ? ? Start     Dose/Rate Route Frequency Ordered Stop  ? 08/02/21 1030  ceFAZolin (ANCEF) IVPB 2g/100 mL premix       ? 2 g ?200 mL/hr over 30 Minutes Intravenous On call to O.R. 08/02/21 1022 08/02/21 1133  ? 08/02/21 0928  cefTRIAXone (ROCEPHIN) 1 g in sodium chloride 0.9 % 100 mL IVPB       ? 1 g ?200 mL/hr over 30 Minutes Intravenous Every 24 hours 08/02/21 0923    ? ?  ? ? ? ?Medications ? ?Scheduled Meds: ? amLODipine  5 mg Oral QHS  ? aspirin EC  81 mg Oral BID  ? docusate sodium  100 mg Oral BID  ? donepezil  5 mg Oral QHS  ? ferrous sulfate  325 mg Oral TID PC  ? folic acid  1 mg Oral Daily  ? insulin aspart  0-9  Units Subcutaneous Q6H  ? labetalol  300 mg Oral BID  ? metFORMIN  500 mg Oral Q breakfast  ? ?Continuous Infusions: ? cefTRIAXone (ROCEPHIN)  IV 1 g (08/04/21 0920)  ? ?PRN Meds:.acetaminophen, alum & mag hydroxide-simeth, bisacodyl, HYDROcodone-acetaminophen, HYDROcodone-acetaminophen, menthol-cetylpyridinium **OR** phenol, morphine injection, naLOXone (NARCAN)  injection, ondansetron (ZOFRAN) IV, polyethylene glycol, sodium phosphate ? ? ? ?Subjective:  ? ?Laura Maxwell was seen and examined today.  No new complaints today.  ?Objective:  ? ?Vitals:  ? 08/03/21 1541 08/03/21 2104 08/04/21 0502 08/04/21 0816  ?BP: 119/61 (!) 135/59 128/60 138/73  ?Pulse: 71 79 88 79  ?Resp: '18 18 18 17  '$ ?Temp: (!) 97.3 ?F (36.3 ?C) 97.9 ?F (36.6 ?C) 97.8 ?F (36.6 ?C) 97.6 ?F (36.4 ?C)  ?TempSrc: Oral Oral Oral Oral  ?SpO2: 97% 98% 98% 95%  ?Weight:      ?Height:      ? ? ?Intake/Output Summary (Last 24 hours) at 08/04/2021 1045 ?Last data filed at 08/03/2021 2239 ?Gross per 24 hour  ?Intake 355.44 ml  ?Output --  ?Net 355.44 ml  ? ? ?Filed Weights  ? 08/01/21 2201 08/02/21 1000  ?Weight: 75 kg 75 kg   ? ? ? ?Exam ?General exam: Appears calm and comfortable  ?Respiratory system: Clear to auscultation. Respiratory effort normal. ?Cardiovascular system: S1 & S2 heard, RRR. No JVD,  No pedal edema. ?Gastrointestinal system: Abdomen is nondistended, soft and nontender.  Normal bowel sounds heard. ?Central nervous system: Alert and oriented. No focal neurological deficits. ?Extremities: painful ROM of the left lower extremity.  ?Skin: No rashes, lesions or ulcers ?Psychiatry: Mood & affect appropriate.  ? ? ? ?Data Reviewed:  I have personally reviewed following labs and imaging studies ? ? ?CBC ?Lab Results  ?Component Value Date  ? WBC 11.6 (H) 08/03/2021  ? RBC 4.54 08/03/2021  ? HGB 13.2 08/04/2021  ? HCT 40.7 08/04/2021  ? MCV 96.5 08/03/2021  ? MCH 31.7 08/03/2021  ? PLT 196 08/03/2021  ? MCHC 32.9 08/03/2021  ? RDW 12.7 08/03/2021  ? LYMPHSABS 1.2 08/03/2021  ? MONOABS 1.7 (H) 08/03/2021  ? EOSABS 0.1 08/03/2021  ? BASOSABS 0.1 08/03/2021  ? ? ? ?Last metabolic panel ?Lab Results  ?Component Value Date  ? NA 138 08/03/2021  ? K 4.1 08/03/2021  ? CL 104 08/03/2021  ? CO2 25 08/03/2021  ? BUN 20 08/03/2021  ? CREATININE 0.81 08/03/2021  ? GLUCOSE 149 (H) 08/03/2021  ? GFRNONAA >60 08/03/2021  ? GFRAA 77 01/29/2020  ? CALCIUM 9.4 08/03/2021  ? PROT 6.0 (L) 08/02/2021  ? ALBUMIN 3.7 08/02/2021  ? LABGLOB 2.0 01/29/2020  ? AGRATIO 2.2 01/29/2020  ? BILITOT 1.0 08/02/2021  ? ALKPHOS 83 08/02/2021  ? AST 12 (L) 08/02/2021  ? ALT 15 08/02/2021  ? ANIONGAP 9 08/03/2021  ? ? ?CBG (last 3)  ?Recent Labs  ?  08/03/21 ?1151 08/03/21 ?2357 08/04/21 ?0552  ?GLUCAP 244* 148* 131*  ? ?  ? ? ?Coagulation Profile: ?Recent Labs  ?Lab 08/01/21 ?2338  ?INR 0.9  ? ? ? ? ?Radiology Studies: ?DG C-Arm 1-60 Min-No Report ? ?Result Date: 08/02/2021 ?Fluoroscopy was utilized by the requesting physician.  No radiographic interpretation.  ? ?DG Hip Port Unilat With Pelvis 1V Left ? ?Result Date: 08/02/2021 ?CLINICAL DATA:  Post open reduction  EXAM: DG HIP (WITH OR WITHOUT PELVIS) 1V PORT LEFT COMPARISON:  None Available. FINDINGS: Interval postsurgical changes left femoral intramedullary nail placement. Hardware components appear in their  expected alignment. Left hip intertrochanteric fracture again seen. No new periprostatic fracture. Expected postoperative changes within the overlying soft tissues. IMPRESSION: Expected postsurgical changes from left femoral intramedullary nail placement for intratrochanteric fracture. Electronically Signed   By: Yetta Glassman M.D.   On: 08/02/2021 15:04  ? ?DG FEMUR MIN 2 VIEWS LEFT ? ?Result Date: 08/02/2021 ?CLINICAL DATA:  ORIF LEFT hip fracture. EXAM: LEFT FEMUR 2 VIEWS COMPARISON:  Prior studies FINDINGS: Intraoperative spot views of the LEFT hip and distal LEFT femur submitted postoperatively for interpretation. Internal nail/screw within the LEFT femur identified traversing an intertrochanteric fracture which appears in near anatomic alignment and position on this study. No gross complicating features noted. IMPRESSION: ORIF LEFT hip fracture. Electronically Signed   By: Margarette Canada M.D.   On: 08/02/2021 12:16   ? ? ? ? ?Hosie Poisson M.D. ?[facility] ?08/04/2021, 10:45 AM ? ?Available via Epic secure chat 7am-7pm ?After 7 pm, please refer to night coverage provider listed on amion. ? ? ? ?

## 2021-08-04 NOTE — TOC Initial Note (Signed)
Transition of Care (TOC) - Initial/Assessment Note  ? ? ?Patient Details  ?Name: Laura Maxwell [facility] ?MRN: [id number] ?Date of Birth: [birth year] ? ?Transition of Care [facility]) CM/SW Contact:    ?Joanne Chars, LCSW ?Phone Number: ?08/04/2021, 12:11 PM ? ?Clinical Narrative:  CSW met with pt   and daughter Jan regarding DC recommendation for SNF.  Pt initially recommended for CIR but CIR reports insurance will not approve for hip fx.  Permission given to speak with daughter Jan present, also to speak with husband Jeneen Rinks, son Merry Proud.  Daughter informed of CIR and asks about self pay for CIR, per [facility], [place] estimate of $3500/day, this was passed on to daughter and they will move forward with SNF instead.  Choice document given, permission given to send out referral in hub. ? ?Referral sent out in hub. ?PASSR went to level 2.           ? ? ?Expected Discharge Plan: [place] ?Barriers to Discharge: Continued Medical Work up, SNF Pending bed offer ? ? ?Patient Goals and CMS Choice ?Patient states their goals for this hospitalization and ongoing recovery are:: walk ?CMS Medicare.gov Compare Post Acute Care list provided to:: Patient Represenative (must comment) ?Choice offered to / list presented to : Adult Children (daughter Jan) ? ?Expected Discharge Plan and Services ?Expected Discharge Plan: [place] ?In-house Referral: Clinical Social Work ?  ?Post Acute Care Choice: [place] ?Living arrangements for the past 2 months: [place] ?                ?  ?  ?  ?  ?  ?  ?  ?  ?  ?  ? ?Prior Living Arrangements/Services ?Living arrangements for the past 2 months: [place] ?Lives with:: Spouse ?Patient language and need for interpreter reviewed:: Yes ?Do you feel safe going back to the place where you live?: Yes      ?Need for Family Participation in Patient Care: Yes (Comment) ?Care giver support system in place?: Yes (comment) ?Current home services: Other  (comment) (none) ?Criminal Activity/Legal Involvement Pertinent to Current Situation/Hospitalization: No - Comment as needed ? ?Activities of Daily Living ?Home Assistive Devices/Equipment: None ?ADL Screening (condition at time of admission) ?Patient's cognitive ability adequate to safely complete daily activities?: Yes ?Is the patient deaf or have difficulty hearing?: No ?Does the patient have difficulty seeing, even when wearing glasses/contacts?: No ?Does the patient have difficulty concentrating, remembering, or making decisions?: Yes ?Patient able to express need for assistance with ADLs?: Yes ?Does the patient have difficulty dressing or bathing?: No ?Independently performs ADLs?: Yes (appropriate for developmental age) ?Does the patient have difficulty walking or climbing stairs?: Yes ?Weakness of Legs: Left ?Weakness of Arms/Hands: None ? ?Permission Sought/Granted ?Permission sought to share information with : Family Supports ?Permission granted to share information with : Yes, Verbal Permission Granted ? Share Information with NAME: husband Jeneen Rinks, daughter Jan, son Merry Proud ? Permission granted to share info w AGENCY: SNF ?   ?   ? ?Emotional Assessment ?Appearance:: Appears stated age ?Attitude/Demeanor/Rapport: Engaged ?Affect (typically observed): Appropriate, Pleasant ?Orientation: : Oriented to Self, Oriented to Place, Oriented to Situation ?Alcohol / Substance Use: Not Applicable ?Psych Involvement: No (comment) ? ?Admission diagnosis:  Closed left hip fracture ([place]) [S72.002A] ?Closed intertrochanteric fracture of left hip, initial encounter ([place]) [S72.142A] ?Patient Active Problem List  ? Diagnosis Date Noted  ? Closed left hip fracture ([place]) 08/02/2021  ? Diabetes mellitus  without complication ([place])   ? Hyperlipidemia   ? Fall at home, initial encounter   ? Leukocytosis   ? Mild cognitive impairment with memory loss 01/29/2020  ? Angioedema 01/20/2013  ? HTN (hypertension) 01/20/2013  ? ?PCP:  Shon Baton, MD ?Pharmacy:   ?[facility] ?[place] ?[place] [address] ?Phone: [phone] Fax: [phone] ? ?[place] ?[place] ?[place] [address] ?Phone: [phone] Fax: [phone] ? ? ? ? ?Social Determinants of Health (SDOH) Interventions ?  ? ?Readmission Risk Interventions ?   ? View : No data to display.  ?  ?  ?  ? ? ? ?

## 2021-08-05 DIAGNOSIS — I10 Essential (primary) hypertension: Secondary | ICD-10-CM | POA: Diagnosis not present

## 2021-08-05 DIAGNOSIS — E119 Type 2 diabetes mellitus without complications: Secondary | ICD-10-CM | POA: Diagnosis not present

## 2021-08-05 DIAGNOSIS — S72002D Fracture of unspecified part of neck of left femur, subsequent encounter for closed fracture with routine healing: Secondary | ICD-10-CM | POA: Diagnosis not present

## 2021-08-05 LAB — URINE CULTURE: Culture: 1000 — AB

## 2021-08-05 LAB — GLUCOSE, CAPILLARY
Glucose-Capillary: 197 mg/dL — ABNORMAL HIGH (ref 70–99)
Glucose-Capillary: 208 mg/dL — ABNORMAL HIGH (ref 70–99)
Glucose-Capillary: 209 mg/dL — ABNORMAL HIGH (ref 70–99)
Glucose-Capillary: 218 mg/dL — ABNORMAL HIGH (ref 70–99)

## 2021-08-05 MED ORDER — HYDROCODONE-ACETAMINOPHEN 5-325 MG PO TABS: 1.0000 | ORAL_TABLET | Freq: Four times a day (QID) | ORAL | 0 refills | Status: AC | PRN

## 2021-08-05 MED ORDER — ASPIRIN EC 81 MG PO TBEC: 81.0000 mg | DELAYED_RELEASE_TABLET | Freq: Two times a day (BID) | ORAL | 0 refills | Status: AC

## 2021-08-05 NOTE — Care Management Important Message (Signed)
Important Message ? ?Patient Details  ?Name: Laura Maxwell [facility] ?MRN: [id number] ?Date of Birth: 07/31/1935 ? ? ?Medicare Important Message Given:  Yes ? ? ? ? ?[name] ?08/05/2021, 2:43 PM ?

## 2021-08-05 NOTE — Progress Notes (Addendum)
Mobility Specialist Progress Note  ? ? 08/05/21 1618  ?Mobility  ?Activity Ambulated with assistance in hallway  ?Level of Assistance +2 (takes two people) ?(chair follow)  ?Assistive Device Front wheel walker  ?LLE Weight Bearing WBAT  ?Distance Ambulated (ft) 170 ft ?(10+150+10)  ?Activity Response Tolerated well  ?$Mobility charge 1 Mobility  ? ?Pt received in bed and agreeable. Had BM in BR. No complaints. Needed VC for gait sequence. Returned to void in BR. Left in bed with call bell in reach and family present. Pt was minA for all mobility.  ? ?Laura Maxwell ?Mobility Specialist  ?Primary: 5N M.S. Phone: [phone] ?Secondary: 6N M.S. Phone: [phone] ?  ?

## 2021-08-05 NOTE — Progress Notes (Signed)
Physical Therapy Treatment ?Patient Details ?Name: Laura Maxwell [facility] ?MRN: [id number] ?DOB: 14-Jan-1936 ?Today's Date: 08/05/2021 ? ? ?History of Present Illness Pt is an 86 y.o. female who fell at home sustaining L hip fx. She underwent IM nailing 5/6. PMH: type 2 diabetes mellitus, hypertension, hyperlipidemia ? ?  ?PT Comments  ? ? Pt received supine and agreeable to session with continued progress towards acute goals. Pt min a for bed mobility with HOB elevated to initiate LLE to EOB. Pt able to come to standing x2 throughout session with min guard for safety with light cues needed for safe hand placement. Pt min guard for ambulation with good recall of sequencing. Pt continues to be limited by pain and fatigue. Pt with fair tolerance for LE therex for increased ROM and strength, HEP handout reviewed and provided. Educated pt and family re; safe stair ascent/descent/ sequencing, increasing activity tolerance slowly, importance of continued mobility and HEP compliance, pt and family verbalizing understanding. Answered all pt and family questions. Current plan remains appropriate to address deficits and maximize functional independence and decrease caregiver burden. Pt continues to benefit from skilled PT services to progress toward functional mobility goals.   ?  ?Recommendations for follow up therapy are one component of a multi-disciplinary discharge planning process, led by the attending physician.  Recommendations may be updated based on patient status, additional functional criteria and insurance authorization. ? ?Follow Up Recommendations ? Acute inpatient rehab (3hours/day) ?  ?  ?Assistance Recommended at Discharge Frequent or constant Supervision/Assistance  ?Patient can return home with the following A little help with walking and/or transfers;Assistance with cooking/housework;Direct supervision/assist for medications management;Assist for transportation;A lot of help with bathing/dressing/bathroom;Help with  stairs or ramp for entrance ?  ?Equipment Recommendations ? Rolling walker (2 wheels)  ?  ?Recommendations for Other Services Rehab consult ? ? ?  ?Precautions / Restrictions Precautions ?Precautions: Fall ?Precaution Comments: watch BP ?Restrictions ?Weight Bearing Restrictions: Yes ?LLE Weight Bearing: Weight bearing as tolerated  ?  ? ?Mobility ? Bed Mobility ?Overal bed mobility: Needs Assistance ?Bed Mobility: Sit to Supine ?  ?  ?Supine to sit: Min assist, HOB elevated ?  ?  ?General bed mobility comments: needed A to initate LLE movement off of bed ?  ? ?Transfers ?Overall transfer level: Needs assistance ?Equipment used: Rolling walker (2 wheels) ?Transfers: Sit to/from Stand ?Sit to Stand: Min guard ?  ?  ?  ?  ?  ?General transfer comment: cues for hand placement, increased time, light assist to steady on rise ?  ? ?Ambulation/Gait ?Ambulation/Gait assistance: Min assist ?Gait Distance (Feet): 50 Feet ?Assistive device: Rolling walker (2 wheels) ?Gait Pattern/deviations: Step-to pattern, Step-through pattern, Decreased stride length, Decreased weight shift to left, Antalgic ?  ?  ?  ?General Gait Details: cues for RW management and sequencing ? ? ?Stairs ?  ?  ?  ?  ?  ? ? ?Wheelchair Mobility ?  ? ?Modified Rankin (Stroke Patients Only) ?  ? ? ?  ?Balance Overall balance assessment: Needs assistance ?Sitting-balance support: No upper extremity supported, Feet supported ?Sitting balance-Leahy Scale: Good ?  ?  ?Standing balance support: Bilateral upper extremity supported ?Standing balance-Leahy Scale: Poor ?  ?  ?  ?  ?  ?  ?  ?  ?  ?  ?  ?  ?  ? ?  ?Cognition Arousal/Alertness: Awake/alert ?Behavior During Therapy: [facility] for tasks assessed/performed ?Overall Cognitive Status: History of cognitive impairments - at baseline ?Area of Impairment: Problem  solving ?  ?  ?  ?  ?  ?  ?  ?  ?  ?Current Attention Level: Selective ?Memory: Decreased short-term memory ?  ?Safety/Judgement: Decreased awareness of  safety ?  ?Problem Solving: Requires verbal cues (intermittently) ?  ?  ?  ? ?  ?Exercises Total Joint Exercises ?Ankle Circles/Pumps: AROM, Both, 20 reps ?Quad Sets: AROM, Left, 10 reps ?Heel Slides: AROM, Left, 5 reps ?Hip ABduction/ADduction: AROM, Left, 10 reps ?Marching in Standing: AROM, AAROM, 5 reps ? ?  ?General Comments General comments (skin integrity, edema, etc.): VSS on RA, no c/o nausea or dizziness ?  ?  ? ?Pertinent Vitals/Pain Pain Assessment ?Pain Assessment: Faces ?Faces Pain Scale: Hurts a little bit ?Pain Location: L hip ?Pain Descriptors / Indicators: Sore, Discomfort ?Pain Intervention(s): Limited activity within patient's tolerance, Monitored during session, Repositioned  ? ? ?Home Living   ?  ?  ?  ?  ?  ?  ?  ?  ?  ?   ?  ?Prior Function    ?  ?  ?   ? ?PT Goals (current goals can now be found in the care plan section) Acute Rehab PT Goals ?Patient Stated Goal: home ?PT Goal Formulation: With patient/family ?Time For Goal Achievement: 08/17/21 ? ?  ?Frequency ? ? ? Min 3X/week ? ? ? ?  ?PT Plan    ? ? ?Co-evaluation   ?  ?  ?  ?  ? ?  ?AM-PAC PT "6 Clicks" Mobility   ?Outcome Measure ? Help needed turning from your back to your side while in a flat bed without using bedrails?: A Little ?Help needed moving from lying on your back to sitting on the side of a flat bed without using bedrails?: A Lot ?Help needed moving to and from a bed to a chair (including a wheelchair)?: A Lot ?Help needed standing up from a chair using your arms (e.g., wheelchair or bedside chair)?: A Little ?Help needed to walk in hospital room?: A Little ?Help needed climbing 3-5 steps with a railing? : Total ?6 Click Score: 14 ? ?  ?End of Session Equipment Utilized During Treatment: Gait belt ?Activity Tolerance: Patient tolerated treatment well ?Patient left: in chair;with call bell/phone within reach;with family/visitor present ?Nurse Communication: Mobility status ?PT Visit Diagnosis: Other abnormalities of gait and  mobility (R26.89);Pain ?Pain - Right/Left: Left ?Pain - part of body: Hip ?  ? ? ?Time: 1829-9371 ?PT Time Calculation (min) (ACUTE ONLY): 28 min ? ?Charges:  $Gait Training: 8-22 mins ?$Therapeutic Exercise: 8-22 mins          ?          ? ?Audry Riles. PTA ?Acute Rehabilitation Services ?Office: [phone] ? ? ?Betsey Holiday Adrianna Dudas ?08/05/2021, 10:51 AM ? ?

## 2021-08-05 NOTE — TOC Progression Note (Addendum)
Transition of Care (TOC) - Progression Note  ? ? ?Patient Details  ?Name: Laura Maxwell [facility] ?MRN: [id number] ?Date of Birth: Aug 20, 1935 ? ?Transition of Care (TOC) CM/SW Contact  ?Joanne Chars, LCSW ?Phone Number: ?08/05/2021, 8:54 AM ? ?Clinical Narrative:   level 2 passr docs uploaded, passr received: [id number] A. ? ?1100: CSW presented bed offers to pt, son and daughter.  They requested additional responses from [place], [place], [place], [facility], and [place].  CSW reached out to these facilities, awaiting responses.   ? ?Per [facility], they do manage this [employer] policy for SNF auth.  ? ? ? ?Expected Discharge Plan: [place] ?Barriers to Discharge: Continued Medical Work up, SNF Pending bed offer ? ?Expected Discharge Plan and Services ?Expected Discharge Plan: [place] ?In-house Referral: Clinical Social Work ?  ?Post Acute Care Choice: [place] ?Living arrangements for the past 2 months: [place] ?                ?  ?  ?  ?  ?  ?  ?  ?  ?  ?  ? ? ?Social Determinants of Health (SDOH) Interventions ?  ? ?Readmission Risk Interventions ?   ? View : No data to display.  ?  ?  ?  ? ? ?

## 2021-08-05 NOTE — Progress Notes (Addendum)
3 days post op left hip IM nail  ?Working with PT when I came by, walking with walker and with slow but steady gate.  ?Weightbearing: WBAT LLE ?Insicional and dressing care: Dressings left intact until follow-up ?VTE prophylaxis: Aspirin '81mg'$  BID x 30 days.  ?Pain control: continue current regimen. Pain medication Rx printed and in chart ?Follow - up plan: 2 weeks with Dr. Percell Miller ?Dispo: patient did not qualify for inptient rehab, TOC on board for SNF placement ? ?Merlene Pulling, PA-C ?

## 2021-08-05 NOTE — Progress Notes (Signed)
? ? ? [facility] ?                                                                            ? ? ?Laura Maxwell, is a 86 y.o. female, DOB - [birth year], RUE:[id number] ?Admit date - 08/01/2021    ?Outpatient Primary MD for the patient is Shon Baton, MD ? ?LOS - 3  days ? ? ? ?Brief summary  ? ?Laura Maxwell is a 86 y.o. female with medical history significant for type 2 diabetes mellitus, hypertension, hyperlipidemia who is admitted to [facility] on 08/01/2021 with acute left intratrochanteric hip fracture after presenting from home to [facility] ED complaining of left hip pain.  ?Orthopedics consulted and she underwent surgical repair.  ? ? ?Assessment & Plan  ? ? ?Assessment and Plan: ? ?* Closed left hip fracture ([place]) ?From a mechanical fall.  ?Orthopedics consulted and pt underwent surgical repair.  ?Pain control and therapy evaluations recommending CIR vs SNF.  Marland Kitchen  ?Therapy eval recommending CIR, but unfortunately no approval. Plan for SNF.  ?Orthopedics recommend aspirin 81 mg BID for 30 days and pain meds as per orthopedics.  ?Outpatient follow up with orthopedics on discharge. ? ? ?Leukocytosis ?? From UTI ?Urine cultures are show 1000 klebsiella.  ?Completed 3 days of IV rocephin.  ? ? ?Hyperlipidemia ?Resume statin on discharge.  ? ?Hypertension;  ?BP parameters are optimal.  ? ? ?Diabetes mellitus without complication ([place]) with hyperglycemia.  ?CBG (last 3)  ?Recent Labs  ?  08/04/21 ?2315 08/05/21 ?1478 08/05/21 ?1254  ?GLUCAP 227* 197* 208*  ? ? ?Non insulin dependent.  ?Resume metformin .  ?Hemoglobin A1c is 8.8. ?No change in meds.  ? ? ?Loose BM  ?Probably from antibiotics , no abdominal pain or discomfort,nausea or vomiting.  ? ?  ? ?Estimated body mass index is 27.51 kg/m? as calculated from the following: ?  Height as of this encounter: '5\' 5"'$  (1.651 m). ?  Weight as of this encounter: 75 kg. ? ?Code Status: full code.  ?DVT Prophylaxis:  SCDs Start: 08/02/21 1512 ?SCDs Start: 08/02/21  0111 ? ? ?Level of Care: Level of care: Telemetry Medical ?Family Communication: Updated patient's family at bedside.  ? ?Disposition Plan:     Remains inpatient appropriate:  Therapy eval.  ? ?Procedures:  ?INTRAMEDULLARY (IM) NAIL INTERTROCHANTRIC on 5//6/23 ? ?Consultants:   ?Dr Percell Miller with orthopedics.  ?Antimicrobials:  ? ?Anti-infectives (From admission, onward)  ? ? Start     Dose/Rate Route Frequency Ordered Stop  ? 08/02/21 1030  ceFAZolin (ANCEF) IVPB 2g/100 mL premix       ? 2 g ?200 mL/hr over 30 Minutes Intravenous On call to O.R. 08/02/21 1022 08/02/21 1133  ? 08/02/21 0928  cefTRIAXone (ROCEPHIN) 1 g in sodium chloride 0.9 % 100 mL IVPB  Status:  Discontinued       ? 1 g ?200 mL/hr over 30 Minutes Intravenous Every 24 hours 08/02/21 2956 08/05/21 2130  ? ?  ? ? ? ?Medications ? ?Scheduled Meds: ? amLODipine  5 mg Oral QHS  ? aspirin EC  81 mg Oral BID  ? docusate sodium  100 mg Oral BID  ? donepezil  5 mg Oral QHS  ? ferrous sulfate  325 mg Oral TID PC  ? folic acid  1 mg Oral Daily  ? insulin aspart  0-9 Units Subcutaneous Q6H  ? labetalol  300 mg Oral BID  ? metFORMIN  500 mg Oral Q breakfast  ? ?Continuous Infusions: ? ? ?PRN Meds:.acetaminophen, alum & mag hydroxide-simeth, bisacodyl, HYDROcodone-acetaminophen, HYDROcodone-acetaminophen, menthol-cetylpyridinium **OR** phenol, morphine injection, naLOXone (NARCAN)  injection, ondansetron (ZOFRAN) IV, polyethylene glycol, sodium phosphate ? ? ? ?Subjective:  ? ?Laura Maxwell was seen and examined today.  No new complaints. No urinary symptoms.  ?Objective:  ? ?Vitals:  ? 08/04/21 2055 08/05/21 0516 08/05/21 0744 08/05/21 1257  ?BP: (!) 141/83 (!) 145/67 (!) 158/68 (!) 125/57  ?Pulse: 91 73 76 69  ?Resp: '18 16 17 17  '$ ?Temp: 98.7 ?F (37.1 ?C) 98 ?F (36.7 ?C) 97.8 ?F (36.6 ?C) 98.3 ?F (36.8 ?C)  ?TempSrc: Oral  Oral Oral  ?SpO2: 97% 90% 95% 97%  ?Weight:      ?Height:      ? ? ?Intake/Output Summary (Last 24 hours) at 08/05/2021 1415 ?Last data filed at  08/05/2021 0700 ?Gross per 24 hour  ?Intake 420 ml  ?Output 2 ml  ?Net 418 ml  ? ? ?Filed Weights  ? 08/01/21 2201 08/02/21 1000  ?Weight: 75 kg 75 kg  ? ? ? ?Exam ?General exam: Appears calm and comfortable  ?Respiratory system: Clear to auscultation. Respiratory effort normal. ?Cardiovascular system: S1 & S2 heard, RRR. No JVD,  No pedal edema. ?Gastrointestinal system: Abdomen is nondistended, soft and nontender. Normal bowel sounds heard. ?Central nervous system: Alert and oriented. No focal neurological deficits. ?Extremities: painful ROM of LLE. ?Skin: No rashes, lesions or ulcers ?Psychiatry:  Mood & affect appropriate.  ? ? ? ?Data Reviewed:  I have personally reviewed following labs and imaging studies ? ? ?CBC ?Lab Results  ?Component Value Date  ? WBC 11.6 (H) 08/03/2021  ? RBC 4.54 08/03/2021  ? HGB 13.2 08/04/2021  ? HCT 40.7 08/04/2021  ? MCV 96.5 08/03/2021  ? MCH 31.7 08/03/2021  ? PLT 196 08/03/2021  ? MCHC 32.9 08/03/2021  ? RDW 12.7 08/03/2021  ? LYMPHSABS 1.2 08/03/2021  ? MONOABS 1.7 (H) 08/03/2021  ? EOSABS 0.1 08/03/2021  ? BASOSABS 0.1 08/03/2021  ? ? ? ?Last metabolic panel ?Lab Results  ?Component Value Date  ? NA 138 08/03/2021  ? K 4.1 08/03/2021  ? CL 104 08/03/2021  ? CO2 25 08/03/2021  ? BUN 20 08/03/2021  ? CREATININE 0.81 08/03/2021  ? GLUCOSE 149 (H) 08/03/2021  ? GFRNONAA >60 08/03/2021  ? GFRAA 77 01/29/2020  ? CALCIUM 9.4 08/03/2021  ? PROT 6.0 (L) 08/02/2021  ? ALBUMIN 3.7 08/02/2021  ? LABGLOB 2.0 01/29/2020  ? AGRATIO 2.2 01/29/2020  ? BILITOT 1.0 08/02/2021  ? ALKPHOS 83 08/02/2021  ? AST 12 (L) 08/02/2021  ? ALT 15 08/02/2021  ? ANIONGAP 9 08/03/2021  ? ? ?CBG (last 3)  ?Recent Labs  ?  08/04/21 ?2315 08/05/21 ?3295 08/05/21 ?1254  ?GLUCAP 227* 197* 208*  ? ?  ? ? ?Coagulation Profile: ?Recent Labs  ?Lab 08/01/21 ?2338  ?INR 0.9  ? ? ? ? ?Radiology Studies: ?No results found. ? ? ? ? ?Hosie Poisson M.D. ?[facility] ?08/05/2021, 2:15 PM ? ?Available via Epic secure chat  7am-7pm ?After 7 pm, please refer to night coverage provider listed on amion. ? ? ? ?

## 2021-08-06 ENCOUNTER — Inpatient Hospital Stay (HOSPITAL_COMMUNITY): Payer: Medicare PPO

## 2021-08-06 DIAGNOSIS — W19XXXA Unspecified fall, initial encounter: Secondary | ICD-10-CM | POA: Diagnosis not present

## 2021-08-06 DIAGNOSIS — E119 Type 2 diabetes mellitus without complications: Secondary | ICD-10-CM | POA: Diagnosis not present

## 2021-08-06 DIAGNOSIS — S72142A Displaced intertrochanteric fracture of left femur, initial encounter for closed fracture: Principal | ICD-10-CM

## 2021-08-06 DIAGNOSIS — I10 Essential (primary) hypertension: Secondary | ICD-10-CM | POA: Diagnosis not present

## 2021-08-06 LAB — GLUCOSE, CAPILLARY
Glucose-Capillary: 176 mg/dL — ABNORMAL HIGH (ref 70–99)
Glucose-Capillary: 176 mg/dL — ABNORMAL HIGH (ref 70–99)
Glucose-Capillary: 207 mg/dL — ABNORMAL HIGH (ref 70–99)
Glucose-Capillary: 238 mg/dL — ABNORMAL HIGH (ref 70–99)

## 2021-08-06 MED ORDER — HYDROCODONE-ACETAMINOPHEN 7.5-325 MG PO TABS: 1.0000 | ORAL_TABLET | ORAL | Status: DC | PRN

## 2021-08-06 MED ORDER — HYDROCODONE-ACETAMINOPHEN 5-325 MG PO TABS
1.0000 | ORAL_TABLET | ORAL | Status: DC | PRN
  Administered 2021-08-06: 1 via ORAL
  Filled 2021-08-06: qty 1

## 2021-08-06 MED ORDER — DICLOFENAC SODIUM 1 % EX GEL
2.0000 g | Freq: Four times a day (QID) | CUTANEOUS | Status: DC | PRN
  Filled 2021-08-06: qty 100

## 2021-08-06 NOTE — Progress Notes (Signed)
Mobility Specialist Progress Note  ? ? 08/06/21 1655  ?Mobility  ?Activity Ambulated with assistance to bathroom  ?Level of Assistance Minimal assist, patient does 75% or more  ?Assistive Device Front wheel walker  ?LLE Weight Bearing WBAT  ?Distance Ambulated (ft) 24 ft ?(12+12)  ?Activity Response Tolerated well  ?$Mobility charge 1 Mobility  ? ?Pt received in chair and agreeable. No complaints on walk. Returned to chair with call bell in reach.   ? ?Laura Maxwell ?Mobility Specialist  ?Primary: 5N M.S. Phone: [phone] ?Secondary: 6N M.S. Phone: [phone] ?  ?

## 2021-08-06 NOTE — Hospital Course (Signed)
Patient pleasant 86 year old female history of type 2 diabetes, hypertension, hyperlipidemia presented to [facility] 08/01/2021 with complaints of left hip pain imaging noted acute left intertrochanteric hip fracture.  Orthopedics consulted patient underwent surgical repair 08/02/2021 without any complications.  Also concern for possible UTI status post 3 days IV Rocephin.  Patient currently medically stable awaiting SNF placement. ?

## 2021-08-06 NOTE — TOC Progression Note (Signed)
Transition of Care (TOC) - Progression Note  ? ? ?Patient Details  ?Name: Jleigh Striplin [facility] ?MRN: [id number] ?Date of Birth: 17-Mar-1936 ? ?Transition of Care (TOC) CM/SW Contact  ?Joanne Chars, LCSW ?Phone Number: ?08/06/2021, 10:08 AM ? ?Clinical Narrative:   CSW provided update on bed offers from [facility], [place], [place].  Spoke with pt and daughter Jan, they have chosen [facility]. ? ?CSW found pending auth request already started in [place], called, this was initiated by [facility], facility choice updated to [facility].   ? ? ? ?Expected Discharge Plan: [place] ?Barriers to Discharge: Continued Medical Work up, SNF Pending bed offer ? ?Expected Discharge Plan and Services ?Expected Discharge Plan: [place] ?In-house Referral: Clinical Social Work ?  ?Post Acute Care Choice: [place] ?Living arrangements for the past 2 months: [place] ?                ?  ?  ?  ?  ?  ?  ?  ?  ?  ?  ? ? ?Social Determinants of Health (SDOH) Interventions ?  ? ?Readmission Risk Interventions ?   ? View : No data to display.  ?  ?  ?  ? ? ?

## 2021-08-06 NOTE — Progress Notes (Signed)
Pt c/o L knee pain and it does looks little swelling on it as well. per family maybe she did hit her knee when she had fall.  Dr Grandville Silos MD and Dr Owens Shark, Virgel Bouquet PA ortho notified. ?

## 2021-08-06 NOTE — Progress Notes (Signed)
Physical Therapy Treatment ?Patient Details ?Name: Laura Maxwell [facility] ?MRN: [id number] ?DOB: [birth year] ?Today's Date: 08/06/2021 ? ? ?History of Present Illness Pt is an 86 y.o. female who fell at home sustaining L hip fx. She underwent IM nailing 5/6. PMH: type 2 diabetes mellitus, hypertension, hyperlipidemia ? ?  ?PT Comments  ? ? Pt received supine for session focused on simulation of safe car (SUV) transfer at pt/family request. Pt min guard for standard bed mobility and ambulation in room throughout session to BR and around room. Pt family reporting SUV seat at 31", bed raised to this height and pt able to lift up on toes, push through RW and settle hips on EOB, with min a pt able to scoot back with feet dangling and swing legs onto bed. Pt able to "exit" with min assist to safely scoot to EOB until feet come to floor and stand with min guard. Pt able to step up backwards and down forwards with RW on step to simulate step stool help into SUV. Family present throughout session and hands on with assist for safe transition. Pt family provided with gait belt for safety at d/c and educated on use with family verbalizing understanding. Pt continues to benefit from skilled PT services to progress toward functional mobility goals.  ? ?  ?Recommendations for follow up therapy are one component of a multi-disciplinary discharge planning process, led by the attending physician.  Recommendations may be updated based on patient status, additional functional criteria and insurance authorization. ? ?Follow Up Recommendations ? Acute inpatient rehab (3hours/day) ?  ?  ?Assistance Recommended at Discharge Frequent or constant Supervision/Assistance  ?Patient can return home with the following A little help with walking and/or transfers;Assistance with cooking/housework;Direct supervision/assist for medications management;Assist for transportation;A lot of help with bathing/dressing/bathroom;Help with stairs or ramp for entrance ?   ?Equipment Recommendations ? Rolling walker (2 wheels)  ?  ?Recommendations for Other Services Rehab consult ? ? ?  ?Precautions / Restrictions Precautions ?Precautions: Fall ?Precaution Comments: watch BP ?Restrictions ?Weight Bearing Restrictions: Yes ?LLE Weight Bearing: Weight bearing as tolerated  ?  ? ?Mobility ? Bed Mobility ?Overal bed mobility: Needs Assistance ?Bed Mobility: Sit to Supine ?  ?  ?Supine to sit: Min assist, HOB elevated ?  ?  ?General bed mobility comments: light min a for LE management cueing ?  ? ?Transfers ?Overall transfer level: Needs assistance ?Equipment used: Rolling walker (2 wheels) ?Transfers: Sit to/from Stand ?Sit to Stand: Min guard, Min assist ?  ?  ?  ?  ?  ?General transfer comment: STS throughout session from EOB, toilet, bed elveated to 31" for simulate SUV transfer, assist needed for safety to stand from VERY elevated bed for safety, pt able come up on toes and elevate lower body to land hips on elevated EOB at 31" ?  ? ?Ambulation/Gait ?Ambulation/Gait assistance: Min guard ?Gait Distance (Feet): 45 Feet ?Assistive device: Rolling walker (2 wheels) ?Gait Pattern/deviations: Step-to pattern, Step-through pattern, Decreased stride length, Decreased weight shift to left, [place] ?  ?  ?  ?General Gait Details: cues for RW management and sequencing ? ? ?Stairs ?Stairs: Yes ?Stairs assistance: Min assist ?Stair Management: No rails, Backwards, With walker ?Number of Stairs: 2 ?General stair comments: stepping up backwards and down forwards x2 to simulate stepping up onto step stool to enter/exit SUV ? ? ?Wheelchair Mobility ?  ? ?Modified Rankin (Stroke Patients Only) ?  ? ? ?  ?Balance Overall balance assessment: Needs assistance ?Sitting-balance support:  No upper extremity supported, Feet supported ?Sitting balance-Leahy Scale: Good ?  ?  ?Standing balance support: Bilateral upper extremity supported ?Standing balance-Leahy Scale: Fair ?  ?  ?  ?  ?  ?  ?  ?  ?  ?  ?  ?   ?  ? ?  ?Cognition Arousal/Alertness: Awake/alert ?Behavior During Therapy: [facility] for tasks assessed/performed ?Overall Cognitive Status: History of cognitive impairments - at baseline ?Area of Impairment: Problem solving ?  ?  ?  ?  ?  ?  ?  ?  ?  ?Current Attention Level: Selective ?Memory: Decreased short-term memory ?  ?Safety/Judgement: Decreased awareness of safety ?  ?Problem Solving: Requires verbal cues (intermittently) ?General Comments: DIL present in room and reports dementia at baseline in pt and pt's spouse. ?  ?  ? ?  ?Exercises   ? ?  ?General Comments   ?  ?  ? ?Pertinent Vitals/Pain Pain Assessment ?Pain Assessment: Faces ?Faces Pain Scale: Hurts a little bit ?Pain Location: L hip ?Pain Descriptors / Indicators: Sore, Discomfort ?Pain Intervention(s): Limited activity within patient's tolerance, Monitored during session, Repositioned  ? ? ?Home Living   ?  ?  ?  ?  ?  ?  ?  ?  ?  ?   ?  ?Prior Function    ?  ?  ?   ? ?PT Goals (current goals can now be found in the care plan section) Acute Rehab PT Goals ?Patient Stated Goal: home ?PT Goal Formulation: With patient/family ?Time For Goal Achievement: 08/17/21 ? ?  ?Frequency ? ? ? Min 3X/week ? ? ? ?  ?PT Plan    ? ? ?Co-evaluation   ?  ?  ?  ?  ? ?  ?AM-PAC PT "6 Clicks" Mobility   ?Outcome Measure ? Help needed turning from your back to your side while in a flat bed without using bedrails?: A Little ?Help needed moving from lying on your back to sitting on the side of a flat bed without using bedrails?: A Lot ?Help needed moving to and from a bed to a chair (including a wheelchair)?: A Lot ?Help needed standing up from a chair using your arms (e.g., wheelchair or bedside chair)?: A Little ?Help needed to walk in hospital room?: A Little ?Help needed climbing 3-5 steps with a railing? : A Lot ?6 Click Score: 15 ? ?  ?End of Session Equipment Utilized During Treatment: Gait belt ?Activity Tolerance: Patient tolerated treatment well ?Patient left: in  chair;with call bell/phone within reach;with family/visitor present ?Nurse Communication: Mobility status ?PT Visit Diagnosis: Other abnormalities of gait and mobility (R26.89);Pain ?Pain - Right/Left: Left ?Pain - part of body: Hip ?  ? ? ?Time: 1001-1030 ?PT Time Calculation (min) (ACUTE ONLY): 29 min ? ?Charges:  $Gait Training: 8-22 mins ?$Therapeutic Activity: 8-22 mins          ?          ? ?Audry Riles. PTA ?Acute Rehabilitation Services ?Office: [phone] ? ? ? ?Laura Maxwell ?08/06/2021, 10:42 AM ? ?

## 2021-08-06 NOTE — Progress Notes (Signed)
?PROGRESS NOTE ? ? ? ?Laura Maxwell  UUV:[id number] DOB: Dec 12, 1935 DOA: 08/01/2021 ?PCP: Shon Baton, MD  ? ? ?Chief Complaint  ?Patient presents with  ? Hip Pain  ?  Patient arrives from home via EMS. Patient had mechanical fall and tripped over dust pan in kitchen and fell onto left hip. EMS stated that patient's leg is shortened. BP 186/80, HR 69, 97% on room air. BG 242.   ? ? ?Brief Narrative:  ?Patient pleasant 86 year old female history of type 2 diabetes, hypertension, hyperlipidemia presented to [facility] 08/01/2021 with complaints of left hip pain imaging noted acute left intertrochanteric hip fracture.  Orthopedics consulted patient underwent surgical repair 08/02/2021 without any complications.  Also concern for possible UTI status post 3 days IV Rocephin.  Patient currently medically stable awaiting SNF placement.  ? ? ?Assessment & Plan: ? Principal Problem: ?  Closed left hip fracture ([place]) ?Active Problems: ?  HTN (hypertension) ?  Diabetes mellitus without complication ([place]) ?  Hyperlipidemia ?  Fall at home, initial encounter ?  Leukocytosis ? ? ? ?Assessment and Plan: ?* Closed left hip fracture ([place]) ? ? ?#) Acute left intratrochanteric hip fracture: confirmed via presenting plain films and stemming from ground level mechanical fall without associated loss of consciousness that occurred earlier on the day of admission. ?-Patient seen in consultation by orthopedics, Dr. Percell Miller and patient subsequently underwent surgical repair 08/02/2021 without any complications. ?-Patient seen by PT OT who are recommending SNF placement. ?-Continue aspirin 81 mg twice daily x30 days per orthopedics for DVT prophylaxis. ?-WBAT. ?-Pain management per orthopedics. ?-We will need outpatient follow-up with orthopedics 2 weeks post discharge. ? ?Leukocytosis ?#) Leukocytosis: ?-Patient noted to have a elevated white count on admission of 13.8.  ?-Felt likely reactive in nature secondary to hip fracture.  ?-Chest  x-ray done negative for any acute infiltrates.  ?-Urinalysis done was nitrite positive leukocytes negative, 0-5 WBCs. ?-Urine cultures done with thousand colonies of Klebsiella pneumoniae. ?-Patient is status post 3 days IV Rocephin. ?-Leukocytosis trending down. ?-No further antibiotics needed. ? ?Fall at home, initial encounter ?#) Ground level mechanical fall: The patient reports a ground level mechanical fall in which she tripped without any associated loss of consciousness. Appears to be purely mechanical in nature, without clinical evidence at this time to suggest contributory dizziness, presyncope, syncope, or acute ischemic CVA. Does not appear to have hit head as component of this fall. presentation does not appear to be associated any acute neurologic deficits.  ?-Patient followed by PT/OT who are recommending SNF. ? ?  ? ? ?Hyperlipidemia ? Hyperlipidemia: documented h/o such. On rosuvastatin as outpatient.  ?-Resume rosuvastatin on discharge. ? ? ? ? ? ? ?Diabetes mellitus without complication ([place]) ?#) Type 2 Diabetes Mellitus: Home insulin regimen: None. Home oral hypoglycemic agents: Jardiance, metformin. presenting blood sugar: 297, without any evidence of corresponding anion gap metabolic acidosis.   ?-Hemoglobin A1c 8.8 (08/02/2021 ). ?-CBG 176 this morning. ?-Continue sliding scale insulin, metformin. ?-Outpatient follow-up. ? ?HTN (hypertension) ? Essential Hypertension: documented h/o such, with outpatient antihypertensive regimen including oral labetalol, amlodipine, spironolactone.  ?-Systolic blood pressures in the 140s.  ?-Continue home regimen labetalol, Norvasc.  ?-Continue to hold spironolactone.  ?-Follow. ? ? ? ? ?  ? ? ?DVT prophylaxis: Aspirin 81 mg twice daily x30 days per orthopedics ?Code Status: Full ?Family Communication: Updated patient, son, daughter-in-law at bedside. ?Disposition: SNF ? ?Status is: Inpatient ?Remains inpatient appropriate because: Unsafe disposition ?   ?Consultants:  ?  Orthopedics: Dr. Percell Miller 08/02/2021 ? ?Procedures:  ?Plain films left hip and pelvis 08/01/2021, 08/02/2021 ?Chest x-ray 08/01/2021 ?Plain films of the left femur 08/02/2021 ?Intramedullary nail intertrochanteric left hip per orthopedics, Dr. Percell Miller 08/02/2021 ? ?Antimicrobials:  ?IV Rocephin 08/02/2021>>>> 08/05/2021 ? ?Subjective: ?\Patient sitting up in chair.  Son and daughter-in-law at bedside.  No chest pain.  No shortness of breath.  No abdominal pain.  Hoping to go to SNF this afternoon ? ?Objective: ?Vitals:  ? 08/05/21 1931 08/06/21 0606 08/06/21 0732 08/06/21 1437  ?BP: (!) 149/62 (!) 142/68 (!) 170/77 (!) 145/67  ?Pulse: 74 70 78 75  ?Resp: '16 16 17 17  '$ ?Temp: 97.6 ?F (36.4 ?C) 98 ?F (36.7 ?C) 97.9 ?F (36.6 ?C) 98.5 ?F (36.9 ?C)  ?TempSrc: Oral Oral Oral Oral  ?SpO2: 96% 95% 94% 97%  ?Weight:      ?Height:      ? ? ?Intake/Output Summary (Last 24 hours) at 08/06/2021 1614 ?Last data filed at 08/05/2021 2213 ?Gross per 24 hour  ?Intake 240 ml  ?Output --  ?Net 240 ml  ? ?Filed Weights  ? 08/01/21 2201 08/02/21 1000  ?Weight: 75 kg 75 kg  ? ? ?Examination: ? ?General exam: Appears calm and comfortable  ?Respiratory system: Clear to auscultation. Respiratory effort normal. ?Cardiovascular system: S1 & S2 heard, RRR. No JVD, murmurs, rubs, gallops or clicks. No pedal edema. ?Gastrointestinal system: Abdomen is nondistended, soft and nontender. No organomegaly or masses felt. Normal bowel sounds heard. ?Central nervous system: Alert and oriented. No focal neurological deficits. ?Extremities: Left hip with postop dressing in place, slight tenderness to palpation, no erythema, no drainage noted.   ?Skin: No rashes, lesions or ulcers ?Psychiatry: Judgement and insight appear normal. Mood & affect appropriate.  ? ? ? ?Data Reviewed:  ? ?CBC: ?Recent Labs  ?Lab 08/01/21 ?2338 08/02/21 ?0400 08/03/21 ?1001 08/04/21 ?0853  ?WBC 13.8* 11.7* 11.6*  --   ?NEUTROABS 11.2* 8.8* 8.3*  --   ?HGB 15.4* 15.0 14.4 13.2  ?HCT  45.7 44.6 43.8 40.7  ?MCV 94.4 94.9 96.5  --   ?PLT 254 215 196  --   ? ? ?Basic Metabolic Panel: ?Recent Labs  ?Lab 08/01/21 ?2338 08/02/21 ?0400 08/03/21 ?1001  ?NA 139 139 138  ?K 3.8 3.7 4.1  ?CL 106 107 104  ?CO2 '23 22 25  '$ ?GLUCOSE 297* 235* 149*  ?BUN '15 13 20  '$ ?CREATININE 0.90 0.75 0.81  ?CALCIUM 9.7 9.5 9.4  ?MG  --  2.2  --   ? ? ?GFR: ?Estimated Creatinine Clearance: 52.4 mL/min (by C-G formula based on SCr of 0.81 mg/dL). ? ?Liver Function Tests: ?Recent Labs  ?Lab 08/02/21 ?0400  ?AST 12*  ?ALT 15  ?ALKPHOS 83  ?BILITOT 1.0  ?PROT 6.0*  ?ALBUMIN 3.7  ? ? ?CBG: ?Recent Labs  ?Lab 08/05/21 ?1254 08/05/21 ?1811 08/05/21 ?2351 08/06/21 ?5035 08/06/21 ?1206  ?GLUCAP 208* 218* 209* 176* 207*  ? ? ? ?Recent Results (from the past 240 hour(s))  ?Urine Culture     Status: Abnormal  ? Collection Time: 08/02/21  9:24 AM  ? Specimen: In/Out Cath Urine  ?Result Value Ref Range Status  ? Specimen Description IN/OUT CATH URINE  Final  ? Special Requests   Final  ?  NONE ?Performed at [facility], [place] [address] [place], [place] [address] ?  ? Culture 1,000 COLONIES/mL KLEBSIELLA PNEUMONIAE (A)  Final  ? Report Status 08/05/2021 FINAL  Final  ? Organism ID, Bacteria KLEBSIELLA  PNEUMONIAE (A)  Final  ?    Susceptibility  ? Klebsiella pneumoniae - MIC*  ?  AMPICILLIN RESISTANT Resistant   ?  CEFAZOLIN <=4 SENSITIVE Sensitive   ?  CEFEPIME <=0.12 SENSITIVE Sensitive   ?  CEFTRIAXONE <=0.25 SENSITIVE Sensitive   ?  CIPROFLOXACIN <=0.25 SENSITIVE Sensitive   ?  GENTAMICIN <=1 SENSITIVE Sensitive   ?  IMIPENEM <=0.25 SENSITIVE Sensitive   ?  NITROFURANTOIN 64 INTERMEDIATE Intermediate   ?  TRIMETH/SULFA <=20 SENSITIVE Sensitive   ?  AMPICILLIN/SULBACTAM 4 SENSITIVE Sensitive   ?  PIP/TAZO <=4 SENSITIVE Sensitive   ?  * 1,000 COLONIES/mL KLEBSIELLA PNEUMONIAE  ?  ? ? ? ? ? ?Radiology Studies: ?No results found. ? ? ? ? ? ?Scheduled Meds: ? amLODipine  5 mg Oral QHS  ? aspirin EC  81 mg Oral BID  ? docusate sodium  100  mg Oral BID  ? donepezil  5 mg Oral QHS  ? ferrous sulfate  325 mg Oral TID PC  ? folic acid  1 mg Oral Daily  ? insulin aspart  0-9 Units Subcutaneous Q6H  ? labetalol  300 mg Oral BID  ? metFORMIN  500 mg Oral Q

## 2021-08-07 DIAGNOSIS — M25562 Pain in left knee: Secondary | ICD-10-CM | POA: Diagnosis not present

## 2021-08-07 DIAGNOSIS — K5900 Constipation, unspecified: Secondary | ICD-10-CM | POA: Diagnosis not present

## 2021-08-07 DIAGNOSIS — M6281 Muscle weakness (generalized): Secondary | ICD-10-CM | POA: Diagnosis not present

## 2021-08-07 DIAGNOSIS — R2689 Other abnormalities of gait and mobility: Secondary | ICD-10-CM | POA: Diagnosis not present

## 2021-08-07 DIAGNOSIS — S72142D Displaced intertrochanteric fracture of left femur, subsequent encounter for closed fracture with routine healing: Secondary | ICD-10-CM | POA: Diagnosis not present

## 2021-08-07 DIAGNOSIS — R4182 Altered mental status, unspecified: Secondary | ICD-10-CM | POA: Diagnosis not present

## 2021-08-07 DIAGNOSIS — G3184 Mild cognitive impairment, so stated: Secondary | ICD-10-CM | POA: Diagnosis not present

## 2021-08-07 DIAGNOSIS — I10 Essential (primary) hypertension: Secondary | ICD-10-CM | POA: Diagnosis not present

## 2021-08-07 DIAGNOSIS — D72829 Elevated white blood cell count, unspecified: Secondary | ICD-10-CM | POA: Diagnosis not present

## 2021-08-07 DIAGNOSIS — W19XXXA Unspecified fall, initial encounter: Secondary | ICD-10-CM | POA: Diagnosis not present

## 2021-08-07 DIAGNOSIS — Z9181 History of falling: Secondary | ICD-10-CM | POA: Diagnosis not present

## 2021-08-07 DIAGNOSIS — T783XXA Angioneurotic edema, initial encounter: Secondary | ICD-10-CM | POA: Diagnosis not present

## 2021-08-07 DIAGNOSIS — Z743 Need for continuous supervision: Secondary | ICD-10-CM | POA: Diagnosis not present

## 2021-08-07 DIAGNOSIS — S72002D Fracture of unspecified part of neck of left femur, subsequent encounter for closed fracture with routine healing: Secondary | ICD-10-CM | POA: Diagnosis not present

## 2021-08-07 DIAGNOSIS — S72002A Fracture of unspecified part of neck of left femur, initial encounter for closed fracture: Secondary | ICD-10-CM | POA: Diagnosis not present

## 2021-08-07 DIAGNOSIS — Y92009 Unspecified place in unspecified non-institutional (private) residence as the place of occurrence of the external cause: Secondary | ICD-10-CM | POA: Diagnosis not present

## 2021-08-07 DIAGNOSIS — R220 Localized swelling, mass and lump, head: Secondary | ICD-10-CM | POA: Diagnosis not present

## 2021-08-07 DIAGNOSIS — S72142A Displaced intertrochanteric fracture of left femur, initial encounter for closed fracture: Secondary | ICD-10-CM | POA: Diagnosis not present

## 2021-08-07 DIAGNOSIS — E785 Hyperlipidemia, unspecified: Secondary | ICD-10-CM | POA: Diagnosis not present

## 2021-08-07 DIAGNOSIS — E119 Type 2 diabetes mellitus without complications: Secondary | ICD-10-CM | POA: Diagnosis not present

## 2021-08-07 LAB — BASIC METABOLIC PANEL
Anion gap: 5 (ref 5–15)
BUN: 15 mg/dL (ref 8–23)
CO2: 25 mmol/L (ref 22–32)
Calcium: 9.3 mg/dL (ref 8.9–10.3)
Chloride: 108 mmol/L (ref 98–111)
Creatinine, Ser: 0.67 mg/dL (ref 0.44–1.00)
GFR, Estimated: >60 mL/min (ref >60)
Glucose, Bld: 175 mg/dL — ABNORMAL HIGH (ref 70–99)
Potassium: 3.8 mmol/L (ref 3.5–5.1)
Sodium: 138 mmol/L (ref 135–145)

## 2021-08-07 LAB — CBC WITH DIFFERENTIAL/PLATELET
Abs Immature Granulocytes: 0.09 10*3/uL — ABNORMAL HIGH (ref 0.00–0.07)
Basophils Absolute: 0.1 10*3/uL (ref 0.0–0.1)
Basophils Relative: 1 %
Eosinophils Absolute: 0.6 10*3/uL — ABNORMAL HIGH (ref 0.0–0.5)
Eosinophils Relative: 7 %
HCT: 37.4 % (ref 36.0–46.0)
Hemoglobin: 12.3 g/dL (ref 12.0–15.0)
Immature Granulocytes: 1 %
Lymphocytes Relative: 20 %
Lymphs Abs: 1.8 10*3/uL (ref 0.7–4.0)
MCH: 31.9 pg (ref 26.0–34.0)
MCHC: 32.9 g/dL (ref 30.0–36.0)
MCV: 96.9 fL (ref 80.0–100.0)
Monocytes Absolute: 1.2 10*3/uL — ABNORMAL HIGH (ref 0.1–1.0)
Monocytes Relative: 13 %
Neutro Abs: 5.3 10*3/uL (ref 1.7–7.7)
Neutrophils Relative %: 58 %
Platelets: 215 10*3/uL (ref 150–400)
RBC: 3.86 MIL/uL — ABNORMAL LOW (ref 3.87–5.11)
RDW: 12.8 % (ref 11.5–15.5)
WBC: 9 10*3/uL (ref 4.0–10.5)
nRBC: 0 % (ref 0.0–0.2)

## 2021-08-07 LAB — GLUCOSE, CAPILLARY
Glucose-Capillary: 197 mg/dL — ABNORMAL HIGH (ref 70–99)
Glucose-Capillary: 191 mg/dL — ABNORMAL HIGH (ref 70–99)
Glucose-Capillary: 233 mg/dL — ABNORMAL HIGH (ref 70–99)
Glucose-Capillary: 168 mg/dL — ABNORMAL HIGH (ref 70–99)

## 2021-08-07 LAB — MAGNESIUM: Magnesium: 2 mg/dL (ref 1.7–2.4)

## 2021-08-07 MED ORDER — DICLOFENAC SODIUM 1 % EX GEL
2.0000 g | Freq: Four times a day (QID) | CUTANEOUS | Status: DC
  Administered 2021-08-07 (×2): 2 g via TOPICAL
  Filled 2021-08-07: qty 100

## 2021-08-07 MED ORDER — FERROUS SULFATE 325 (65 FE) MG PO TABS: 325.0000 mg | ORAL_TABLET | Freq: Three times a day (TID) | ORAL | 3 refills | Status: AC

## 2021-08-07 MED ORDER — DICLOFENAC SODIUM 1 % EX GEL: CUTANEOUS | Status: AC

## 2021-08-07 MED ORDER — SENNOSIDES-DOCUSATE SODIUM 8.6-50 MG PO TABS: 1.0000 | ORAL_TABLET | Freq: Two times a day (BID) | ORAL | Status: AC

## 2021-08-07 MED ORDER — SPIRONOLACTONE 25 MG PO TABS
25.0000 mg | ORAL_TABLET | Freq: Every day | ORAL | Status: DC
  Administered 2021-08-07: 25 mg via ORAL
  Filled 2021-08-07: qty 1

## 2021-08-07 NOTE — Discharge Summary (Signed)
Physician Discharge Summary  ?Laura Maxwell MBT:[id number] DOB: 05-03-1935 DOA: 08/01/2021 ? ?PCP: Shon Baton, MD ? ?Admit date: 08/01/2021 ?Discharge date: 08/07/2021 ? ?Time spent: 55 minutes ? ?Recommendations for Outpatient Follow-up:  ?Patient will be discharged to skilled nursing facility.  Follow-up with MD at skilled nursing facility.  Patient will need a basic metabolic profile done in 1 week to follow-up on electrolytes and renal function. ?Follow-up with Dr. [name], orthopedics in 2 weeks. ? ? ?Discharge Diagnoses:  ?Principal Problem: ?  Closed left hip fracture ([place]) ?Active Problems: ?  HTN (hypertension) ?  Diabetes mellitus without complication ([place]) ?  Hyperlipidemia ?  Fall at home, initial encounter ?  Leukocytosis ?  Closed intertrochanteric fracture of left hip, initial encounter ([place]) ?  Left knee pain ? ? ?Discharge Condition: Stable and improved ? ?Diet recommendation: Carb modified diet ? ?Filed Weights  ? 08/01/21 2201 08/02/21 1000 08/07/21 0500  ?Weight: 75 kg 75 kg 76 kg  ? ? ?History of present illness:  ?HPI per Dr. Velia Meyer ? Laura Maxwell is a 86 y.o. female with medical history significant for type 2 diabetes mellitus, hypertension, hyperlipidemia who is admitted to [facility] on 08/01/2021 with acute left intratrochanteric hip fracture after presenting from home to [facility] ED complaining of left hip pain.  ?  ?The following history is obtained via my discussions with the patient as well as my discussions with the EDP, and via chart review. ?  ?Patient reports tripping while attempting to ambulate at home, resulting in a ground level fall during which left hip was the principal point of contact with the floor below. As a result of this fall, the patient reports immediate development of sharp left hip pain, with radiation into the left groin. States that this pain has been constant since onset, with exacerbation when attempting to move the left lower extremity.  As a  consequence of the associated intensity of this discomfort, reports that he is unable to bear weight on the left lower extremity at this time.  This is relative to baseline functional status in which the patient lives independently as well as baseline ambulatory status in which the patient reports the ability to ambulate without assistance, including no need for support devices.  Otherwise, denies any acute arthralgias or myalgias as a result of the above fall.  Denies any acute numbness or paresthesias in bilateral lower extremities, and confirms that left hip representations a native joint. ?  ?Did not hit head as a component of this fall, and denies any associated loss of consciousness.  Denies any preceding or associated chest pain, shortness of breath, diaphoresis, palpitations, nausea, vomiting, dizziness, presyncope, or syncope.  Denies any subsequent headache, neck pain, blurry vision, or diplopia.  Not on any blood thinners as an outpatient, including no aspirin.  Denies any known history of coronary artery disease or CHF. denies any recent orthopnea, PND, or peripheral edema.  ?  ?  ?  ?  ?ED Course:  ?Vital signs in the ED were notable for the following: Afebrile; heart rate 66-83; initial blood pressure 180/78, which is subsequently decreased to 145/73 following interval improvement in pain control via administration of IV analgesia, as further detailed below; respiratory rate 15-17, oxygen saturation 95 to 97% on room air. ?  ?Labs were notable for the following: BMP notable for the following: Sodium 139, bicarbonate 23, anion gap 10, creatinine 0.90, glucose 297.  CBC notable for white blood cell count 13,800 with  80% neutrophils, hemoglobin 15.4, platelet count 254.  INR 0.9. ?  ?Imaging and additional notable ED work-up: EKG shows sinus rhythm with heart rate 66, normal intervals, and no evidence of T wave or ST changes, including no evidence of ST elevation.  Plain films of the left hip show acute  left femoral intratrochanteric fracture.  ?  ?EDP discussed the patient's case and imaging with the on-call orthopedic surgeon, Dr. [name], who recommended admission to the hospitalist service for further evaluation and management of acute left hip fracture.  Dr. Percell Miller to formally consult, will evaluate the patient in the morning. Will strive to take patient to OR on 08/02/21 for definitive surgical correction of her acute left hip fracture, however, this will also depend upon OR availability. In the meantime, Dr. Percell Miller requested the patient be kept n.p.o. ?  ?While in the ED, the following were administered: Morphine 4 mg IV x1 dose. ?  ?Subsequently, the patient was admitted for further evaluation management, including definitive surgical correction of presenting acute left intratrochanteric hip fracture in the setting of presenting ground-level mechanical fall, with presenting labs notable for mild leukocytosis. ?  ?Hospital Course:  ? ?Assessment and Plan: ?* Closed left hip fracture ([place]) ? ? ?#) Acute left intratrochanteric hip fracture: confirmed via presenting plain films and stemming from ground level mechanical fall without associated loss of consciousness that occurred earlier on the day of admission. ?-Patient seen in consultation by orthopedics, Dr. Percell Miller and patient subsequently underwent surgical repair 08/02/2021 without any complications. ?-Patient seen by PT/ OT who are recommending SNF placement. ?-Patient placed on aspirin 81 mg twice daily x30 days per orthopedics for DVT prophylaxis. ?-WBAT. ?-Pain management per orthopedics. ?-Outpatient follow-up with orthopedics 2 weeks post discharge. ? ?Left knee pain ?- During the hospitalization patient with complaints of left knee pain. ?-Plain films of the left knee which were done were negative for any acute fracture or abnormalities. ?-Patient place on Voltaren gel 4 times daily for the next 7 days and then 4 times daily as  needed. ?-Outpatient follow-up with orthopedics. ? ?Leukocytosis ?#) Leukocytosis: ?-Patient noted to have a elevated white count on admission of 13.8.  ?-Felt likely reactive in nature secondary to hip fracture.  ?-Chest x-ray done negative for any acute infiltrates.  ?-Urinalysis done was nitrite positive leukocytes negative, 0-5 WBCs. ?-Urine cultures done with 1000 colonies of Klebsiella pneumoniae. ?-Patient is status post 3 days IV Rocephin. ?-Leukocytosis trended down and had resolved by day of discharge. ?-No further antibiotics needed. ? ?Fall at home, initial encounter ?#) Ground level mechanical fall: The patient reports a ground level mechanical fall in which she tripped without any associated loss of consciousness. Appears to be purely mechanical in nature, without clinical evidence at this time to suggest contributory dizziness, presyncope, syncope, or acute ischemic CVA. Does not appear to have hit head as component of this fall. presentation does not appear to be associated any acute neurologic deficits.  ?-Patient followed by PT/OT who are recommended SNF. ? ?  ? ? ?Hyperlipidemia ? Hyperlipidemia: documented h/o such. On rosuvastatin as outpatient.  ?-Resume rosuvastatin on discharge. ? ? ? ? ? ? ?Diabetes mellitus without complication ([place]) ?#) Type 2 Diabetes Mellitus: Home insulin regimen: None. Home oral hypoglycemic agents: Jardiance, metformin. presenting blood sugar: 297, without any evidence of corresponding anion gap metabolic acidosis.   ?-Hemoglobin A1c 8.8 (08/02/2021 ). ?-Patient maintained on sliding scale insulin and metformin during the hospitalization.   ?-Home regimen Jardiance will  be resumed on discharge.   ?-Patient will be discharged to skilled nursing facility. ? ?HTN (hypertension) ? Essential Hypertension: documented h/o such, with outpatient antihypertensive regimen including oral labetalol, amlodipine, spironolactone.  ?-Systolic blood pressures in the 140s.  ?-Patient  initially placed back on home regimen labetalol and Norvasc and spironolactone held. ?-Spironolactone subsequently resumed. ?-Outpatient follow-up. ? ? ? ? ?  ? ?Procedures: ?Plain films left hip and pelvis 08/01/2021, 08/02/2021 ?Chest

## 2021-08-07 NOTE — Progress Notes (Signed)
? ? ? ?Subjective: ?5 Days Post-Op s/p Procedure(s): ?INTRAMEDULLARY (IM) NAIL INTERTROCHANTRIC ? ? Patient is alert, oriented. States she has not pain at rest. Ha s moderate-severe pain at left hip with movement. Did starting having some knee pain yesterday.  ?Denies chest pain, SOB, Calf pain. No nausea/vomiting. No other complaints. ? ?Objective:  ?PE: ?VITALS:   ?Vitals:  ? 08/06/21 2232 08/07/21 0500 08/07/21 0539 08/07/21 0733  ?BP: (!) 160/80  (!) 146/56 (!) 169/82  ?Pulse: 80  65 72  ?Resp:   16 18  ?Temp:   98.4 ?F (36.9 ?C) 98 ?F (36.7 ?C)  ?TempSrc:   Oral Oral  ?SpO2:   93% 98%  ?Weight:  76 kg    ?Height:      ? ?General: laying in bed, in no acute distress ?MSK: EHL and FHL intact. Distal sensation intact. 2+ dp pulse. No effusion or erythema on exam today. AROM 0-90 at knee put this does create pain at hip. Dressings CDI. ? ?LABS ? ?Results for orders placed or performed during the hospital encounter of 08/01/21 (from the past 24 hour(s))  ?Glucose, capillary     Status: Abnormal  ? Collection Time: 08/06/21 12:06 PM  ?Result Value Ref Range  ? Glucose-Capillary 207 (H) 70 - 99 mg/dL  ?Glucose, capillary     Status: Abnormal  ? Collection Time: 08/06/21  5:15 PM  ?Result Value Ref Range  ? Glucose-Capillary 176 (H) 70 - 99 mg/dL  ?Glucose, capillary     Status: Abnormal  ? Collection Time: 08/06/21 11:54 PM  ?Result Value Ref Range  ? Glucose-Capillary 238 (H) 70 - 99 mg/dL  ?Basic metabolic panel     Status: Abnormal  ? Collection Time: 08/07/21  2:34 AM  ?Result Value Ref Range  ? Sodium 138 135 - 145 mmol/L  ? Potassium 3.8 3.5 - 5.1 mmol/L  ? Chloride 108 98 - 111 mmol/L  ? CO2 25 22 - 32 mmol/L  ? Glucose, Bld 175 (H) 70 - 99 mg/dL  ? BUN 15 8 - 23 mg/dL  ? Creatinine, Ser 0.67 0.44 - 1.00 mg/dL  ? Calcium 9.3 8.9 - 10.3 mg/dL  ? GFR, Estimated >60 >60 mL/min  ? Anion gap 5 5 - 15  ?CBC with Differential/Platelet     Status: Abnormal  ? Collection Time: 08/07/21  2:34 AM  ?Result Value Ref Range   ? WBC 9.0 4.0 - 10.5 K/uL  ? RBC 3.86 (L) 3.87 - 5.11 MIL/uL  ? Hemoglobin 12.3 12.0 - 15.0 g/dL  ? HCT 37.4 36.0 - 46.0 %  ? MCV 96.9 80.0 - 100.0 fL  ? MCH 31.9 26.0 - 34.0 pg  ? MCHC 32.9 30.0 - 36.0 g/dL  ? RDW 12.8 11.5 - 15.5 %  ? Platelets 215 150 - 400 K/uL  ? nRBC 0.0 0.0 - 0.2 %  ? Neutrophils Relative % 58 %  ? Neutro Abs 5.3 1.7 - 7.7 K/uL  ? Lymphocytes Relative 20 %  ? Lymphs Abs 1.8 0.7 - 4.0 K/uL  ? Monocytes Relative 13 %  ? Monocytes Absolute 1.2 (H) 0.1 - 1.0 K/uL  ? Eosinophils Relative 7 %  ? Eosinophils Absolute 0.6 (H) 0.0 - 0.5 K/uL  ? Basophils Relative 1 %  ? Basophils Absolute 0.1 0.0 - 0.1 K/uL  ? Immature Granulocytes 1 %  ? Abs Immature Granulocytes 0.09 (H) 0.00 - 0.07 K/uL  ?Magnesium     Status: None  ? Collection Time: 08/07/21  2:34 AM  ?Result Value Ref Range  ? Magnesium 2.0 1.7 - 2.4 mg/dL  ?Glucose, capillary     Status: Abnormal  ? Collection Time: 08/07/21  7:10 AM  ?Result Value Ref Range  ? Glucose-Capillary 197 (H) 70 - 99 mg/dL  ?Glucose, capillary     Status: Abnormal  ? Collection Time: 08/07/21  7:33 AM  ?Result Value Ref Range  ? Glucose-Capillary 191 (H) 70 - 99 mg/dL  ? ? ?DG Knee Left Port ? ?Result Date: 08/06/2021 ?CLINICAL DATA:  Left knee pain. EXAM: PORTABLE LEFT KNEE - 1-2 VIEW COMPARISON:  None Available. FINDINGS: Two views of left knee demonstrate an intramedullary nail in the distal left femur. The left knee is located without a fracture. No joint effusion. Enthesopathic changes along the anterior aspect of the patella. Minimal spurring involving the patellofemoral compartment of the knee. IMPRESSION: No acute bone abnormality in left knee. Electronically Signed   By: Markus Daft M.D.   On: 08/06/2021 19:20   ? ?Assessment/Plan: ?Closed left hip fracture ?5 Days Post-Op s/p Procedure(s): ?INTRAMEDULLARY (IM) NAIL INTERTROCHANTRIC ? ?Left knee pain ?- new as of yesterday, xray taken showing no fracture, dislocation, no issues with IM nail. No effusion or  erythema on exam today. Will continue to follow as outpatient.  ? ?Weightbearing: WBAT LLE ?Insicional and dressing care: Dressings left intact until follow-up ?VTE prophylaxis: Aspirin '81mg'$  BID x 30 days.  ?Pain control: continue current regimen. Pain medication Rx printed and in chart ?Follow - up plan: 2 weeks with Dr. Percell Miller ?Dispo: patient did not qualify for inptient rehab, TOC on board for SNF placement. Okay to discharge from ortho standpoint when patient has SNF bed/cleared by medicine ? ?Contact information:   ?Merlene Pulling, [place] Weekdays 8-5  ?After hours and holidays please check [email or web address] for group call information for [facility] ? ?Ventura Bruns ?08/07/2021, 10:04 AM  ?

## 2021-08-07 NOTE — Progress Notes (Signed)
Occupational Therapy Treatment ?Patient Details ?Name: [facility] ?MRN: [id number] ?DOB: 06/17/1935 ?Today's Date: 08/07/2021 ? ? ?History of present illness Pt is an 86 y.o. female who fell at home sustaining L hip fx. She underwent IM nailing 5/6. PMH: type 2 diabetes mellitus, hypertension, hyperlipidemia ?  ?OT comments ? Pt progressing well towards OT goals with focus on ADL completion after setup with NT. Pt able to complete remainder of toileting task and LB bathing with Min A at most. Pt with continued pain but able to ambulate using RW in room with min guard. Pt reports plan to DC to SNF rehab soon and hopeful to regain independence.   ? ?Recommendations for follow up therapy are one component of a multi-disciplinary discharge planning process, led by the attending physician.  Recommendations may be updated based on patient status, additional functional criteria and insurance authorization. ?   ?Follow Up Recommendations ? Skilled nursing-short term rehab (<3 hours/day)  ?  ?Assistance Recommended at Discharge Intermittent Supervision/Assistance  ?Patient can return home with the following ? A little help with walking and/or transfers;A little help with bathing/dressing/bathroom;Assistance with cooking/housework;Assist for transportation;Help with stairs or ramp for entrance;Direct supervision/assist for financial management;Direct supervision/assist for medications management ?  ?Equipment Recommendations ? BSC/3in1  ?  ?Recommendations for Other Services   ? ?  ?Precautions / Restrictions Precautions ?Precautions: Fall ?Restrictions ?Weight Bearing Restrictions: Yes ?LLE Weight Bearing: Weight bearing as tolerated  ? ? ?  ? ?Mobility Bed Mobility ?Overal bed mobility: Needs Assistance ?Bed Mobility: Sit to Supine ?  ?  ?  ?Sit to supine: Min assist ?  ?General bed mobility comments: assist to get L LE back into bed ?  ? ?Transfers ?Overall transfer level: Needs assistance ?Equipment used: Rolling walker  (2 wheels) ?Transfers: Sit to/from Stand ?Sit to Stand: Min guard ?  ?  ?  ?  ?  ?General transfer comment: to stand from reg toilet ?  ?  ?Balance Overall balance assessment: Needs assistance ?Sitting-balance support: No upper extremity supported, Feet supported ?Sitting balance-Leahy Scale: Good ?  ?  ?Standing balance support: Bilateral upper extremity supported ?Standing balance-Leahy Scale: Fair ?  ?  ?  ?  ?  ?  ?  ?  ?  ?  ?  ?  ?   ? ?ADL either performed or assessed with clinical judgement  ? ?ADL Overall ADL's : Needs assistance/impaired ?  ?  ?Grooming: Supervision/safety;Standing;Wash/dry hands;Oral care ?Grooming Details (indicate cue type and reason): without UE support standing at sink, no LOB ?  ?  ?Lower Body Bathing: Minimal assistance;Sitting/lateral leans;Sit to/from stand ?Lower Body Bathing Details (indicate cue type and reason): able to bathe thighs/peri area seated on toilet. light assist for B feet ?Upper Body Dressing : Set up;Sitting ?  ?  ?  ?Toilet Transfer: Financial planner (2 wheels);Grab bars ?Toilet Transfer Details (indicate cue type and reason): increased time/effort to stand from reg toilet but able to do so with minor cues for posture/weight shifting ?Toileting- Water quality scientist and Hygiene: Min guard;Sitting/lateral lean;Sit to/from stand ?Toileting - Clothing Manipulation Details (indicate cue type and reason): for clothing mgmt in standing ?  ?  ?Functional mobility during ADLs: Min guard;Rolling walker (2 wheels) ?  ?  ? ?Extremity/Trunk Assessment Upper Extremity Assessment ?Upper Extremity Assessment: Generalized weakness ?  ?Lower Extremity Assessment ?Lower Extremity Assessment: Defer to PT evaluation ?  ?  ?  ? ?Vision   ?Vision Assessment?: No apparent visual deficits ?  ?  Perception   ?  ?Praxis   ?  ? ?Cognition Arousal/Alertness: Awake/alert ?Behavior During Therapy: [facility] for tasks assessed/performed ?Overall Cognitive Status: History of  cognitive impairments - at baseline ?Area of Impairment: Problem solving ?  ?  ?  ?  ?  ?  ?  ?  ?  ?  ?  ?  ?  ?  ?Problem Solving: Requires verbal cues ?General Comments: dementia at baseline but overall functional. minor cues for problem solving and sequencing unfamiliar tasks ?  ?  ?   ?Exercises   ? ?  ?Shoulder Instructions   ? ? ?  ?General Comments    ? ? ?Pertinent Vitals/ Pain       Pain Assessment ?Pain Assessment: Faces ?Faces Pain Scale: Hurts little more ?Pain Location: L hip ?Pain Descriptors / Indicators: Grimacing ?Pain Intervention(s): Monitored during session ? ?Home Living   ?  ?  ?  ?  ?  ?  ?  ?  ?  ?  ?  ?  ?  ?  ?  ?  ?  ?  ? ?  ?Prior Functioning/Environment    ?  ?  ?  ?   ? ?Frequency ? Min 2X/week  ? ? ? ? ?  ?Progress Toward Goals ? ?OT Goals(current goals can now be found in the care plan section) ? Progress towards OT goals: Progressing toward goals ? ?Acute Rehab OT Goals ?Patient Stated Goal: decreased pain ?OT Goal Formulation: With patient ?Time For Goal Achievement: 08/17/21 ?Potential to Achieve Goals: Good ?ADL Goals ?Pt Will Perform Upper Body Dressing: with modified independence;sitting ?Pt Will Perform Lower Body Dressing: with min assist;sit to/from stand;sitting/lateral leans;with adaptive equipment ?Pt Will Transfer to Toilet: with supervision;regular height toilet;ambulating ?Pt Will Perform Tub/Shower Transfer: Shower transfer;Tub transfer;rolling walker;ambulating;3 in 1;with min assist  ?Plan Discharge plan remains appropriate   ? ?Co-evaluation ? ? ?   ?  ?  ?  ?  ? ?  ?AM-PAC OT "6 Clicks" Daily Activity     ?Outcome Measure ? ? Help from another person eating meals?: None ?Help from another person taking care of personal grooming?: A Little ?Help from another person toileting, which includes using toliet, bedpan, or urinal?: A Little ?Help from another person bathing (including washing, rinsing, drying)?: A Little ?Help from another person to put on and taking off  regular upper body clothing?: A Little ?Help from another person to put on and taking off regular lower body clothing?: A Little ?6 Click Score: 19 ? ?  ?End of Session Equipment Utilized During Treatment: Rolling walker (2 wheels) ? ?OT Visit Diagnosis: Unsteadiness on feet (R26.81);Other abnormalities of gait and mobility (R26.89);Muscle weakness (generalized) (M62.81);Pain ?Pain - Right/Left: Left ?Pain - part of body: Leg ?  ?Activity Tolerance Patient tolerated treatment well ?  ?Patient Left in bed;with call bell/phone within reach;with bed alarm set ?  ?Nurse Communication Mobility status ?  ? ?   ? ?Time: 0981-1914 ?OT Time Calculation (min): 18 min ? ?Charges: OT General Charges ?$OT Visit: 1 Visit ?OT Treatments ?$Self Care/Home Management : 8-22 mins ? ?Malachy Chamber, OTR/L ?Acute Rehab Services ?Office: [phone]  ? ?Layla Maw ?08/07/2021, 8:08 AM ?

## 2021-08-07 NOTE — Progress Notes (Signed)
Mobility Specialist Progress Note  ? ? 08/07/21 1145  ?Mobility  ?Activity Ambulated with assistance in hallway  ?Level of Assistance Contact guard assist, steadying assist  ?Assistive Device Front wheel walker  ?LLE Weight Bearing WBAT  ?Distance Ambulated (ft) 170 ft  ?Activity Response Tolerated well  ?$Mobility charge 1 Mobility  ? ?Pt received in bed and agreeable. Had void in BR. Stated pain was not as bad as it has been. Returned to chair with call bell in reach and family present.  ? ?Hildred Alamin ?Mobility Specialist  ?Primary: 5N M.S. Phone: [phone] ?Secondary: 6N M.S. Phone: [phone] ?  ?

## 2021-08-07 NOTE — Discharge Planning (Signed)
Attempted to call report at number provided. Redirected to number to reach acting nurse supervisior. Mailbox full, unable to leave VM. ?

## 2021-08-07 NOTE — Progress Notes (Signed)
Attempted to give report x2, and left a message via voicemail. Still awaiting a return call. ?

## 2021-08-07 NOTE — TOC Progression Note (Signed)
Transition of Care (TOC) - Progression Note  ? ? ?Patient Details  ?Name: Laura [facility] ?MRN: [id number] ?Date of Birth: May 30, 1935 ? ?Transition of Care (TOC) CM/SW Contact  ?Joanne Chars, LCSW ?Phone Number: ?08/07/2021, 1:01 PM ? ?Clinical Narrative:   Auth approved in [place]: [id number], [id number], 5 days: 5/11-5/15.  MD notified, [place] aware.  ? ? ? ?Expected Discharge Plan: [place] ?Barriers to Discharge: Continued Medical Work up, SNF Pending bed offer ? ?Expected Discharge Plan and Services ?Expected Discharge Plan: [place] ?In-house Referral: Clinical Social Work ?  ?Post Acute Care Choice: [place] ?Living arrangements for the past 2 months: [place] ?                ?  ?  ?  ?  ?  ?  ?  ?  ?  ?  ? ? ?Social Determinants of Health (SDOH) Interventions ?  ? ?Readmission Risk Interventions ?   ? View : No data to display.  ?  ?  ?  ? ? ?

## 2021-08-07 NOTE — Discharge Planning (Signed)
Patient removed from unit via [facility] transport on stretcher. Discharge uneventful.  ?

## 2021-08-07 NOTE — Assessment & Plan Note (Signed)
-   During the hospitalization patient with complaints of left knee pain. ?-Plain films of the left knee which were done were negative for any acute fracture or abnormalities. ?-Patient place on Voltaren gel 4 times daily for the next 7 days and then 4 times daily as needed. ?-Outpatient follow-up with orthopedics. ?

## 2021-08-07 NOTE — TOC Transition Note (Signed)
Transition of Care (TOC) - CM/SW Discharge Note ? ? ?Patient Details  ?Name: Laura Maxwell [facility] ?MRN: [id number] ?Date of Birth: 15-Jan-1936 ? ?Transition of Care [facility]) CM/SW Contact:  ?Joanne Chars, LCSW ?Phone Number: ?08/07/2021, 2:44 PM ? ? ?Clinical Narrative:   Pt discharging to [facility].  RN call [phone] for report.  ? ? ? ?Final next level of care: [place] ?Barriers to Discharge: Barriers Resolved ? ? ?Patient Goals and CMS Choice ?Patient states their goals for this hospitalization and ongoing recovery are:: walk ?CMS Medicare.gov Compare Post Acute Care list provided to:: Patient Represenative (must comment) ?Choice offered to / list presented to : Adult Children (daughter Jan) ? ?Discharge Placement ?  ?           ?Patient chooses bed at:  [facility]) ?Patient to be transferred to facility by: [facility] ?Name of family member notified: daughter Jan, husband Jeneen Rinks in room ?Patient and family notified of of transfer: 08/07/21 ? ?Discharge Plan and Services ?In-house Referral: Clinical Social Work ?  ?Post Acute Care Choice: [place]          ?  ?  ?  ?  ?  ?  ?  ?  ?  ?  ? ?Social Determinants of Health (SDOH) Interventions ?  ? ? ?Readmission Risk Interventions ?   ? View : No data to display.  ?  ?  ?  ? ? ? ? ? ?

## 2021-08-12 ENCOUNTER — Ambulatory Visit: Payer: Medicare PPO | Admitting: Family Medicine

## 2021-08-15 DIAGNOSIS — S72142D Displaced intertrochanteric fracture of left femur, subsequent encounter for closed fracture with routine healing: Secondary | ICD-10-CM | POA: Diagnosis not present

## 2021-08-19 DIAGNOSIS — R220 Localized swelling, mass and lump, head: Secondary | ICD-10-CM | POA: Diagnosis not present

## 2021-08-22 DIAGNOSIS — Z9181 History of falling: Secondary | ICD-10-CM | POA: Diagnosis not present

## 2021-08-22 DIAGNOSIS — R2689 Other abnormalities of gait and mobility: Secondary | ICD-10-CM | POA: Diagnosis not present

## 2021-08-22 DIAGNOSIS — M6281 Muscle weakness (generalized): Secondary | ICD-10-CM | POA: Diagnosis not present

## 2021-08-28 DIAGNOSIS — S72145A Nondisplaced intertrochanteric fracture of left femur, initial encounter for closed fracture: Secondary | ICD-10-CM | POA: Diagnosis not present

## 2021-08-28 DIAGNOSIS — M25562 Pain in left knee: Secondary | ICD-10-CM | POA: Diagnosis not present

## 2021-08-28 DIAGNOSIS — I10 Essential (primary) hypertension: Secondary | ICD-10-CM | POA: Diagnosis not present

## 2021-08-28 DIAGNOSIS — N390 Urinary tract infection, site not specified: Secondary | ICD-10-CM | POA: Diagnosis not present

## 2021-08-28 DIAGNOSIS — F0390 Unspecified dementia without behavioral disturbance: Secondary | ICD-10-CM | POA: Diagnosis not present

## 2021-08-28 DIAGNOSIS — E119 Type 2 diabetes mellitus without complications: Secondary | ICD-10-CM | POA: Diagnosis not present

## 2021-08-28 DIAGNOSIS — D72829 Elevated white blood cell count, unspecified: Secondary | ICD-10-CM | POA: Diagnosis not present

## 2021-08-28 DIAGNOSIS — R220 Localized swelling, mass and lump, head: Secondary | ICD-10-CM | POA: Diagnosis not present

## 2021-08-28 DIAGNOSIS — M8580 Other specified disorders of bone density and structure, unspecified site: Secondary | ICD-10-CM | POA: Diagnosis not present

## 2021-08-29 ENCOUNTER — Other Ambulatory Visit (HOSPITAL_COMMUNITY): Payer: Self-pay | Admitting: Internal Medicine

## 2021-08-29 ENCOUNTER — Ambulatory Visit (HOSPITAL_COMMUNITY)
Admission: RE | Admit: 2021-08-29 | Discharge: 2021-08-29 | Disposition: A | Discharge status: Home / Self Care | Payer: Medicare PPO | Source: Ambulatory Visit | Attending: Internal Medicine | Admitting: Internal Medicine

## 2021-08-29 DIAGNOSIS — M7989 Other specified soft tissue disorders: Secondary | ICD-10-CM

## 2021-09-12 DIAGNOSIS — I82452 Acute embolism and thrombosis of left peroneal vein: Secondary | ICD-10-CM | POA: Diagnosis not present

## 2021-09-12 DIAGNOSIS — S72145A Nondisplaced intertrochanteric fracture of left femur, initial encounter for closed fracture: Secondary | ICD-10-CM | POA: Diagnosis not present

## 2021-09-19 DIAGNOSIS — S72142D Displaced intertrochanteric fracture of left femur, subsequent encounter for closed fracture with routine healing: Secondary | ICD-10-CM | POA: Diagnosis not present

## 2021-11-21 DIAGNOSIS — J3089 Other allergic rhinitis: Secondary | ICD-10-CM | POA: Diagnosis not present

## 2021-11-21 DIAGNOSIS — R220 Localized swelling, mass and lump, head: Secondary | ICD-10-CM | POA: Diagnosis not present

## 2021-11-21 DIAGNOSIS — T783XXD Angioneurotic edema, subsequent encounter: Secondary | ICD-10-CM | POA: Diagnosis not present

## 2021-11-28 DIAGNOSIS — R220 Localized swelling, mass and lump, head: Secondary | ICD-10-CM | POA: Diagnosis not present

## 2021-11-28 DIAGNOSIS — T783XXD Angioneurotic edema, subsequent encounter: Secondary | ICD-10-CM | POA: Diagnosis not present

## 2021-11-28 DIAGNOSIS — F0390 Unspecified dementia without behavioral disturbance: Secondary | ICD-10-CM | POA: Diagnosis not present

## 2021-12-05 DIAGNOSIS — R220 Localized swelling, mass and lump, head: Secondary | ICD-10-CM | POA: Diagnosis not present

## 2021-12-15 DIAGNOSIS — I10 Essential (primary) hypertension: Secondary | ICD-10-CM | POA: Diagnosis not present

## 2021-12-15 DIAGNOSIS — E559 Vitamin D deficiency, unspecified: Secondary | ICD-10-CM | POA: Diagnosis not present

## 2021-12-15 DIAGNOSIS — E785 Hyperlipidemia, unspecified: Secondary | ICD-10-CM | POA: Diagnosis not present

## 2021-12-15 DIAGNOSIS — R7989 Other specified abnormal findings of blood chemistry: Secondary | ICD-10-CM | POA: Diagnosis not present

## 2021-12-18 DIAGNOSIS — Z6825 Body mass index (BMI) 25.0-25.9, adult: Secondary | ICD-10-CM | POA: Diagnosis not present

## 2021-12-18 DIAGNOSIS — M0609 Rheumatoid arthritis without rheumatoid factor, multiple sites: Secondary | ICD-10-CM | POA: Diagnosis not present

## 2021-12-18 DIAGNOSIS — R220 Localized swelling, mass and lump, head: Secondary | ICD-10-CM | POA: Diagnosis not present

## 2021-12-18 DIAGNOSIS — Z79899 Other long term (current) drug therapy: Secondary | ICD-10-CM | POA: Diagnosis not present

## 2021-12-18 DIAGNOSIS — E663 Overweight: Secondary | ICD-10-CM | POA: Diagnosis not present

## 2021-12-18 DIAGNOSIS — R5382 Chronic fatigue, unspecified: Secondary | ICD-10-CM | POA: Diagnosis not present

## 2021-12-19 ENCOUNTER — Other Ambulatory Visit: Payer: Self-pay | Admitting: Otolaryngology

## 2021-12-19 DIAGNOSIS — R220 Localized swelling, mass and lump, head: Secondary | ICD-10-CM

## 2021-12-22 DIAGNOSIS — D692 Other nonthrombocytopenic purpura: Secondary | ICD-10-CM | POA: Diagnosis not present

## 2021-12-22 DIAGNOSIS — M069 Rheumatoid arthritis, unspecified: Secondary | ICD-10-CM | POA: Diagnosis not present

## 2021-12-22 DIAGNOSIS — Z1331 Encounter for screening for depression: Secondary | ICD-10-CM | POA: Diagnosis not present

## 2021-12-22 DIAGNOSIS — E785 Hyperlipidemia, unspecified: Secondary | ICD-10-CM | POA: Diagnosis not present

## 2021-12-22 DIAGNOSIS — F0390 Unspecified dementia without behavioral disturbance: Secondary | ICD-10-CM | POA: Diagnosis not present

## 2021-12-22 DIAGNOSIS — E119 Type 2 diabetes mellitus without complications: Secondary | ICD-10-CM | POA: Diagnosis not present

## 2021-12-22 DIAGNOSIS — I82402 Acute embolism and thrombosis of unspecified deep veins of left lower extremity: Secondary | ICD-10-CM | POA: Diagnosis not present

## 2021-12-22 DIAGNOSIS — R82998 Other abnormal findings in urine: Secondary | ICD-10-CM | POA: Diagnosis not present

## 2021-12-22 DIAGNOSIS — Z0000 Encounter for general adult medical examination without abnormal findings: Secondary | ICD-10-CM | POA: Diagnosis not present

## 2021-12-22 DIAGNOSIS — Z1389 Encounter for screening for other disorder: Secondary | ICD-10-CM | POA: Diagnosis not present

## 2021-12-22 DIAGNOSIS — I10 Essential (primary) hypertension: Secondary | ICD-10-CM | POA: Diagnosis not present

## 2021-12-22 DIAGNOSIS — E559 Vitamin D deficiency, unspecified: Secondary | ICD-10-CM | POA: Diagnosis not present

## 2022-01-06 ENCOUNTER — Inpatient Hospital Stay: Admission: RE | Admit: 2022-01-06 | Payer: Medicare PPO | Source: Ambulatory Visit

## 2022-01-06 ENCOUNTER — Ambulatory Visit
Admission: RE | Admit: 2022-01-06 | Discharge: 2022-01-06 | Disposition: A | Discharge status: Home / Self Care | Payer: Medicare PPO | Source: Ambulatory Visit | Attending: Otolaryngology | Admitting: Otolaryngology

## 2022-01-06 DIAGNOSIS — R220 Localized swelling, mass and lump, head: Secondary | ICD-10-CM | POA: Diagnosis not present

## 2022-01-06 DIAGNOSIS — M2669 Other specified disorders of temporomandibular joint: Secondary | ICD-10-CM | POA: Diagnosis not present

## 2022-01-06 DIAGNOSIS — Z20828 Contact with and (suspected) exposure to other viral communicable diseases: Secondary | ICD-10-CM | POA: Diagnosis not present

## 2022-01-06 DIAGNOSIS — J3489 Other specified disorders of nose and nasal sinuses: Secondary | ICD-10-CM | POA: Diagnosis not present

## 2022-01-06 MED ORDER — IOPAMIDOL (ISOVUE-300) INJECTION 61%
75.0000 mL | Freq: Once | INTRAVENOUS | Status: AC | PRN
  Administered 2022-01-06: 75 mL via INTRAVENOUS

## 2022-02-03 DIAGNOSIS — L900 Lichen sclerosus et atrophicus: Secondary | ICD-10-CM | POA: Diagnosis not present

## 2022-03-03 ENCOUNTER — Other Ambulatory Visit: Payer: Self-pay | Admitting: Internal Medicine

## 2022-03-03 DIAGNOSIS — I82402 Acute embolism and thrombosis of unspecified deep veins of left lower extremity: Secondary | ICD-10-CM

## 2022-03-13 ENCOUNTER — Other Ambulatory Visit: Payer: Medicare PPO

## 2022-03-19 ENCOUNTER — Ambulatory Visit
Admission: RE | Admit: 2022-03-19 | Discharge: 2022-03-19 | Disposition: A | Discharge status: Home / Self Care | Payer: Medicare PPO | Source: Ambulatory Visit | Attending: Internal Medicine | Admitting: Internal Medicine

## 2022-03-19 DIAGNOSIS — I82402 Acute embolism and thrombosis of unspecified deep veins of left lower extremity: Secondary | ICD-10-CM

## 2022-04-19 DIAGNOSIS — R52 Pain, unspecified: Secondary | ICD-10-CM | POA: Diagnosis not present

## 2022-05-12 DIAGNOSIS — E785 Hyperlipidemia, unspecified: Secondary | ICD-10-CM | POA: Diagnosis not present

## 2022-05-12 DIAGNOSIS — E119 Type 2 diabetes mellitus without complications: Secondary | ICD-10-CM | POA: Diagnosis not present

## 2022-05-12 DIAGNOSIS — I82402 Acute embolism and thrombosis of unspecified deep veins of left lower extremity: Secondary | ICD-10-CM | POA: Diagnosis not present

## 2022-05-12 DIAGNOSIS — M810 Age-related osteoporosis without current pathological fracture: Secondary | ICD-10-CM | POA: Diagnosis not present

## 2022-05-12 DIAGNOSIS — M069 Rheumatoid arthritis, unspecified: Secondary | ICD-10-CM | POA: Diagnosis not present

## 2022-05-12 DIAGNOSIS — E559 Vitamin D deficiency, unspecified: Secondary | ICD-10-CM | POA: Diagnosis not present

## 2022-05-12 DIAGNOSIS — I10 Essential (primary) hypertension: Secondary | ICD-10-CM | POA: Diagnosis not present

## 2022-05-12 DIAGNOSIS — D692 Other nonthrombocytopenic purpura: Secondary | ICD-10-CM | POA: Diagnosis not present

## 2022-05-12 DIAGNOSIS — F0390 Unspecified dementia without behavioral disturbance: Secondary | ICD-10-CM | POA: Diagnosis not present

## 2022-05-29 DIAGNOSIS — R682 Dry mouth, unspecified: Secondary | ICD-10-CM | POA: Diagnosis not present

## 2022-05-29 DIAGNOSIS — R220 Localized swelling, mass and lump, head: Secondary | ICD-10-CM | POA: Diagnosis not present

## 2022-05-29 DIAGNOSIS — J300 Vasomotor rhinitis: Secondary | ICD-10-CM | POA: Diagnosis not present

## 2022-06-29 DIAGNOSIS — R197 Diarrhea, unspecified: Secondary | ICD-10-CM | POA: Diagnosis not present

## 2022-08-17 DIAGNOSIS — R197 Diarrhea, unspecified: Secondary | ICD-10-CM | POA: Diagnosis not present

## 2022-08-17 DIAGNOSIS — D692 Other nonthrombocytopenic purpura: Secondary | ICD-10-CM | POA: Diagnosis not present

## 2022-08-17 DIAGNOSIS — E119 Type 2 diabetes mellitus without complications: Secondary | ICD-10-CM | POA: Diagnosis not present

## 2022-08-17 DIAGNOSIS — F0390 Unspecified dementia without behavioral disturbance: Secondary | ICD-10-CM | POA: Diagnosis not present

## 2022-08-17 DIAGNOSIS — Z79899 Other long term (current) drug therapy: Secondary | ICD-10-CM | POA: Diagnosis not present

## 2022-08-17 DIAGNOSIS — K760 Fatty (change of) liver, not elsewhere classified: Secondary | ICD-10-CM | POA: Diagnosis not present

## 2022-08-17 DIAGNOSIS — E559 Vitamin D deficiency, unspecified: Secondary | ICD-10-CM | POA: Diagnosis not present

## 2022-08-17 DIAGNOSIS — I10 Essential (primary) hypertension: Secondary | ICD-10-CM | POA: Diagnosis not present

## 2022-08-17 DIAGNOSIS — E785 Hyperlipidemia, unspecified: Secondary | ICD-10-CM | POA: Diagnosis not present

## 2022-08-28 DIAGNOSIS — N390 Urinary tract infection, site not specified: Secondary | ICD-10-CM | POA: Diagnosis not present

## 2022-08-28 DIAGNOSIS — N898 Other specified noninflammatory disorders of vagina: Secondary | ICD-10-CM | POA: Diagnosis not present

## 2022-08-28 DIAGNOSIS — L900 Lichen sclerosus et atrophicus: Secondary | ICD-10-CM | POA: Diagnosis not present

## 2022-11-13 ENCOUNTER — Ambulatory Visit: Payer: Medicare PPO | Admitting: Podiatry

## 2022-11-13 DIAGNOSIS — L603 Nail dystrophy: Secondary | ICD-10-CM | POA: Diagnosis not present

## 2022-11-13 DIAGNOSIS — B351 Tinea unguium: Secondary | ICD-10-CM

## 2022-11-13 NOTE — Progress Notes (Unsigned)
Subjective:  Patient ID: Laura Maxwell, female    DOB: [birth year],  MRN: [id number]  Chief Complaint  Patient presents with   Nail Problem    BILAT NAIL FUNGUS     87 y.o. female presents with concern for bilateral nail fungus.  She is concerned about some thickening and abnormal growth of the nails.  Patient does have a history of diabetes type 2 without significant complication  Past Medical History:  Diagnosis Date   Allergic    Pt is under care of allergy doctor due to swelling of tongue-unsure of med   Cataract    bil/had surgery   Chronic cystitis    per [facility]   Colon polyp    recurrent; per [facility]   CTS (carpal tunnel syndrome)    per [facility]   Diabetes mellitus without complication (HCC)    Diverticulosis    Fatty liver    per [facility]   H/O vertigo    per [facility]   Hyperlipidemia    Hypertension    Neckache    per [facility]   OSA (obstructive sleep apnea)    Mild; per [facility]   Osteoarthritis    per [facility]   Osteopenia    per [facility]   Rheumatoid arthritis [facility])    pt states she has been told she is in remission    Allergies  Allergen Reactions   Ace Inhibitors Swelling and Other (See Comments)    The patient experienced angioedema (tongue became swollen) after taking quinapril.  She states that she had been taking this medicine "for years" without any side effects/adverse reactions.  This may also have been caused by the Azo she took.    Phenazopyridine Swelling and Other (See Comments)    The patient started taking this the week before her reaction.  Her last dose was 4 days prior to the angioedema.  This may also have been cause be her ace inhibitor.     ROS: Negative except as per HPI above  Objective:  General: AAO x3, NAD  Dermatological: Nail painted with nail  polish.  However there is significant thickening dystrophy and abnormal growth of all nails which is visible.  Unable to determine coloration or ridging pattern no pain on palpation of the nails  Vascular:  Dorsalis Pedis artery and Posterior Tibial artery pedal pulses are 2/4 bilateral.  Capillary fill time < 3 sec to all digits.   Neruologic: Grossly intact via light touch bilateral. Protective threshold intact to all sites bilateral.   Musculoskeletal: No gross boney pedal deformities bilateral. No pain, crepitus, or limitation noted with foot and ankle range of motion bilateral. Muscular strength 5/5 in all groups tested bilateral.  Gait: Unassisted, Nonantalgic.   No images are attached to the encounter.   Assessment:   1. Nail dystrophy   2. Onychomycosis      Plan:  Patient was evaluated and treated and all questions answered.  # Nail dystrophy and onychomycosis -Had extensive discussion with the patient regarding treatment options for nail fungal infection. -Discussed the options of oral or topical antifungal as well as temporary or permanent total nail avulsion if nails were painful especially the hallux nails. -Discussed the risk benefits of alternatives to each of these treatments.  Also discussed the risk associate with the oral antifungal and the relative ineffectiveness of topical antifungal -At this time patient would prefer to defer  any treatment though if she changes her mind encouraged her to call back and we can send in the medication he had of the topical or perform nail avulsions as needed.  All questions were answered to the satisfaction of the patient         Corinna Gab, DPM [facility]

## 2023-01-01 DIAGNOSIS — D72819 Decreased white blood cell count, unspecified: Secondary | ICD-10-CM | POA: Diagnosis not present

## 2023-01-01 DIAGNOSIS — E559 Vitamin D deficiency, unspecified: Secondary | ICD-10-CM | POA: Diagnosis not present

## 2023-01-01 DIAGNOSIS — I10 Essential (primary) hypertension: Secondary | ICD-10-CM | POA: Diagnosis not present

## 2023-01-01 DIAGNOSIS — E119 Type 2 diabetes mellitus without complications: Secondary | ICD-10-CM | POA: Diagnosis not present

## 2023-01-01 DIAGNOSIS — E785 Hyperlipidemia, unspecified: Secondary | ICD-10-CM | POA: Diagnosis not present

## 2023-01-04 DIAGNOSIS — M0609 Rheumatoid arthritis without rheumatoid factor, multiple sites: Secondary | ICD-10-CM | POA: Diagnosis not present

## 2023-01-04 DIAGNOSIS — M2550 Pain in unspecified joint: Secondary | ICD-10-CM | POA: Diagnosis not present

## 2023-01-04 DIAGNOSIS — E663 Overweight: Secondary | ICD-10-CM | POA: Diagnosis not present

## 2023-01-04 DIAGNOSIS — R5382 Chronic fatigue, unspecified: Secondary | ICD-10-CM | POA: Diagnosis not present

## 2023-01-04 DIAGNOSIS — Z79899 Other long term (current) drug therapy: Secondary | ICD-10-CM | POA: Diagnosis not present

## 2023-01-04 DIAGNOSIS — Z6827 Body mass index (BMI) 27.0-27.9, adult: Secondary | ICD-10-CM | POA: Diagnosis not present

## 2023-01-08 DIAGNOSIS — D692 Other nonthrombocytopenic purpura: Secondary | ICD-10-CM | POA: Diagnosis not present

## 2023-01-08 DIAGNOSIS — I10 Essential (primary) hypertension: Secondary | ICD-10-CM | POA: Diagnosis not present

## 2023-01-08 DIAGNOSIS — Z0000 Encounter for general adult medical examination without abnormal findings: Secondary | ICD-10-CM | POA: Diagnosis not present

## 2023-01-08 DIAGNOSIS — E119 Type 2 diabetes mellitus without complications: Secondary | ICD-10-CM | POA: Diagnosis not present

## 2023-01-08 DIAGNOSIS — R82998 Other abnormal findings in urine: Secondary | ICD-10-CM | POA: Diagnosis not present

## 2023-01-08 DIAGNOSIS — E785 Hyperlipidemia, unspecified: Secondary | ICD-10-CM | POA: Diagnosis not present

## 2023-01-08 DIAGNOSIS — K760 Fatty (change of) liver, not elsewhere classified: Secondary | ICD-10-CM | POA: Diagnosis not present

## 2023-01-08 DIAGNOSIS — E669 Obesity, unspecified: Secondary | ICD-10-CM | POA: Diagnosis not present

## 2023-01-08 DIAGNOSIS — F0390 Unspecified dementia without behavioral disturbance: Secondary | ICD-10-CM | POA: Diagnosis not present

## 2023-01-08 DIAGNOSIS — E559 Vitamin D deficiency, unspecified: Secondary | ICD-10-CM | POA: Diagnosis not present

## 2023-01-08 DIAGNOSIS — Z23 Encounter for immunization: Secondary | ICD-10-CM | POA: Diagnosis not present

## 2023-04-13 DIAGNOSIS — D692 Other nonthrombocytopenic purpura: Secondary | ICD-10-CM | POA: Diagnosis not present

## 2023-04-13 DIAGNOSIS — I10 Essential (primary) hypertension: Secondary | ICD-10-CM | POA: Diagnosis not present

## 2023-04-13 DIAGNOSIS — E669 Obesity, unspecified: Secondary | ICD-10-CM | POA: Diagnosis not present

## 2023-04-13 DIAGNOSIS — Z79899 Other long term (current) drug therapy: Secondary | ICD-10-CM | POA: Diagnosis not present

## 2023-04-13 DIAGNOSIS — M810 Age-related osteoporosis without current pathological fracture: Secondary | ICD-10-CM | POA: Diagnosis not present

## 2023-04-13 DIAGNOSIS — E119 Type 2 diabetes mellitus without complications: Secondary | ICD-10-CM | POA: Diagnosis not present

## 2023-04-13 DIAGNOSIS — F0390 Unspecified dementia without behavioral disturbance: Secondary | ICD-10-CM | POA: Diagnosis not present

## 2023-04-13 DIAGNOSIS — E785 Hyperlipidemia, unspecified: Secondary | ICD-10-CM | POA: Diagnosis not present

## 2023-04-13 DIAGNOSIS — I82402 Acute embolism and thrombosis of unspecified deep veins of left lower extremity: Secondary | ICD-10-CM | POA: Diagnosis not present

## 2023-04-19 DIAGNOSIS — M19032 Primary osteoarthritis, left wrist: Secondary | ICD-10-CM | POA: Diagnosis not present

## 2023-04-19 DIAGNOSIS — M25531 Pain in right wrist: Secondary | ICD-10-CM | POA: Diagnosis not present

## 2023-04-19 DIAGNOSIS — M25532 Pain in left wrist: Secondary | ICD-10-CM | POA: Diagnosis not present

## 2023-04-19 DIAGNOSIS — M19031 Primary osteoarthritis, right wrist: Secondary | ICD-10-CM | POA: Diagnosis not present

## 2023-05-03 DIAGNOSIS — R300 Dysuria: Secondary | ICD-10-CM | POA: Diagnosis not present

## 2023-05-03 DIAGNOSIS — L900 Lichen sclerosus et atrophicus: Secondary | ICD-10-CM | POA: Diagnosis not present

## 2023-05-21 DIAGNOSIS — K6289 Other specified diseases of anus and rectum: Secondary | ICD-10-CM | POA: Diagnosis not present

## 2023-06-01 DIAGNOSIS — M6281 Muscle weakness (generalized): Secondary | ICD-10-CM | POA: Diagnosis not present

## 2023-06-01 DIAGNOSIS — R41841 Cognitive communication deficit: Secondary | ICD-10-CM | POA: Diagnosis not present

## 2023-06-02 DIAGNOSIS — M6281 Muscle weakness (generalized): Secondary | ICD-10-CM | POA: Diagnosis not present

## 2023-06-02 DIAGNOSIS — R41841 Cognitive communication deficit: Secondary | ICD-10-CM | POA: Diagnosis not present

## 2023-06-03 DIAGNOSIS — I10 Essential (primary) hypertension: Secondary | ICD-10-CM | POA: Diagnosis not present

## 2023-06-03 DIAGNOSIS — M6281 Muscle weakness (generalized): Secondary | ICD-10-CM | POA: Diagnosis not present

## 2023-06-04 DIAGNOSIS — R41841 Cognitive communication deficit: Secondary | ICD-10-CM | POA: Diagnosis not present

## 2023-06-04 DIAGNOSIS — M6281 Muscle weakness (generalized): Secondary | ICD-10-CM | POA: Diagnosis not present

## 2023-06-07 DIAGNOSIS — R682 Dry mouth, unspecified: Secondary | ICD-10-CM | POA: Diagnosis not present

## 2023-06-07 DIAGNOSIS — R41841 Cognitive communication deficit: Secondary | ICD-10-CM | POA: Diagnosis not present

## 2023-06-07 DIAGNOSIS — J300 Vasomotor rhinitis: Secondary | ICD-10-CM | POA: Diagnosis not present

## 2023-06-07 DIAGNOSIS — R220 Localized swelling, mass and lump, head: Secondary | ICD-10-CM | POA: Diagnosis not present

## 2023-06-07 DIAGNOSIS — M6281 Muscle weakness (generalized): Secondary | ICD-10-CM | POA: Diagnosis not present

## 2023-06-08 DIAGNOSIS — M6281 Muscle weakness (generalized): Secondary | ICD-10-CM | POA: Diagnosis not present

## 2023-06-08 DIAGNOSIS — I10 Essential (primary) hypertension: Secondary | ICD-10-CM | POA: Diagnosis not present

## 2023-06-09 DIAGNOSIS — R262 Difficulty in walking, not elsewhere classified: Secondary | ICD-10-CM | POA: Diagnosis not present

## 2023-06-09 DIAGNOSIS — M6281 Muscle weakness (generalized): Secondary | ICD-10-CM | POA: Diagnosis not present

## 2023-06-09 DIAGNOSIS — I10 Essential (primary) hypertension: Secondary | ICD-10-CM | POA: Diagnosis not present

## 2023-06-09 DIAGNOSIS — R41841 Cognitive communication deficit: Secondary | ICD-10-CM | POA: Diagnosis not present

## 2023-06-10 DIAGNOSIS — R41841 Cognitive communication deficit: Secondary | ICD-10-CM | POA: Diagnosis not present

## 2023-06-10 DIAGNOSIS — M6281 Muscle weakness (generalized): Secondary | ICD-10-CM | POA: Diagnosis not present

## 2023-06-14 DIAGNOSIS — M810 Age-related osteoporosis without current pathological fracture: Secondary | ICD-10-CM | POA: Diagnosis not present

## 2023-06-14 DIAGNOSIS — R42 Dizziness and giddiness: Secondary | ICD-10-CM | POA: Diagnosis not present

## 2023-06-14 DIAGNOSIS — M6281 Muscle weakness (generalized): Secondary | ICD-10-CM | POA: Diagnosis not present

## 2023-06-14 DIAGNOSIS — R41841 Cognitive communication deficit: Secondary | ICD-10-CM | POA: Diagnosis not present

## 2023-06-14 DIAGNOSIS — F0390 Unspecified dementia without behavioral disturbance: Secondary | ICD-10-CM | POA: Diagnosis not present

## 2023-06-14 DIAGNOSIS — E119 Type 2 diabetes mellitus without complications: Secondary | ICD-10-CM | POA: Diagnosis not present

## 2023-06-14 DIAGNOSIS — I10 Essential (primary) hypertension: Secondary | ICD-10-CM | POA: Diagnosis not present

## 2023-06-14 DIAGNOSIS — M1990 Unspecified osteoarthritis, unspecified site: Secondary | ICD-10-CM | POA: Diagnosis not present

## 2023-06-14 DIAGNOSIS — Z8781 Personal history of (healed) traumatic fracture: Secondary | ICD-10-CM | POA: Diagnosis not present

## 2023-06-14 DIAGNOSIS — R413 Other amnesia: Secondary | ICD-10-CM | POA: Diagnosis not present

## 2023-06-14 DIAGNOSIS — E785 Hyperlipidemia, unspecified: Secondary | ICD-10-CM | POA: Diagnosis not present

## 2023-06-16 DIAGNOSIS — R41841 Cognitive communication deficit: Secondary | ICD-10-CM | POA: Diagnosis not present

## 2023-06-16 DIAGNOSIS — M6281 Muscle weakness (generalized): Secondary | ICD-10-CM | POA: Diagnosis not present

## 2023-06-16 DIAGNOSIS — I10 Essential (primary) hypertension: Secondary | ICD-10-CM | POA: Diagnosis not present

## 2023-06-16 DIAGNOSIS — R262 Difficulty in walking, not elsewhere classified: Secondary | ICD-10-CM | POA: Diagnosis not present

## 2023-06-17 DIAGNOSIS — M6281 Muscle weakness (generalized): Secondary | ICD-10-CM | POA: Diagnosis not present

## 2023-06-17 DIAGNOSIS — R41841 Cognitive communication deficit: Secondary | ICD-10-CM | POA: Diagnosis not present

## 2023-06-18 DIAGNOSIS — I10 Essential (primary) hypertension: Secondary | ICD-10-CM | POA: Diagnosis not present

## 2023-06-18 DIAGNOSIS — M6281 Muscle weakness (generalized): Secondary | ICD-10-CM | POA: Diagnosis not present

## 2023-06-21 DIAGNOSIS — R41841 Cognitive communication deficit: Secondary | ICD-10-CM | POA: Diagnosis not present

## 2023-06-21 DIAGNOSIS — M6281 Muscle weakness (generalized): Secondary | ICD-10-CM | POA: Diagnosis not present

## 2023-06-22 DIAGNOSIS — R262 Difficulty in walking, not elsewhere classified: Secondary | ICD-10-CM | POA: Diagnosis not present

## 2023-06-22 DIAGNOSIS — R41841 Cognitive communication deficit: Secondary | ICD-10-CM | POA: Diagnosis not present

## 2023-06-22 DIAGNOSIS — I10 Essential (primary) hypertension: Secondary | ICD-10-CM | POA: Diagnosis not present

## 2023-06-22 DIAGNOSIS — M6281 Muscle weakness (generalized): Secondary | ICD-10-CM | POA: Diagnosis not present

## 2023-06-23 DIAGNOSIS — R41841 Cognitive communication deficit: Secondary | ICD-10-CM | POA: Diagnosis not present

## 2023-06-23 DIAGNOSIS — I10 Essential (primary) hypertension: Secondary | ICD-10-CM | POA: Diagnosis not present

## 2023-06-23 DIAGNOSIS — M6281 Muscle weakness (generalized): Secondary | ICD-10-CM | POA: Diagnosis not present

## 2023-06-24 DIAGNOSIS — R262 Difficulty in walking, not elsewhere classified: Secondary | ICD-10-CM | POA: Diagnosis not present

## 2023-06-24 DIAGNOSIS — R41841 Cognitive communication deficit: Secondary | ICD-10-CM | POA: Diagnosis not present

## 2023-06-24 DIAGNOSIS — M6281 Muscle weakness (generalized): Secondary | ICD-10-CM | POA: Diagnosis not present

## 2023-06-25 DIAGNOSIS — I10 Essential (primary) hypertension: Secondary | ICD-10-CM | POA: Diagnosis not present

## 2023-06-25 DIAGNOSIS — M6281 Muscle weakness (generalized): Secondary | ICD-10-CM | POA: Diagnosis not present

## 2023-06-29 DIAGNOSIS — M6281 Muscle weakness (generalized): Secondary | ICD-10-CM | POA: Diagnosis not present

## 2023-06-29 DIAGNOSIS — I10 Essential (primary) hypertension: Secondary | ICD-10-CM | POA: Diagnosis not present

## 2023-06-29 DIAGNOSIS — R262 Difficulty in walking, not elsewhere classified: Secondary | ICD-10-CM | POA: Diagnosis not present

## 2023-06-30 DIAGNOSIS — M6281 Muscle weakness (generalized): Secondary | ICD-10-CM | POA: Diagnosis not present

## 2023-06-30 DIAGNOSIS — R41841 Cognitive communication deficit: Secondary | ICD-10-CM | POA: Diagnosis not present

## 2023-07-01 DIAGNOSIS — R41841 Cognitive communication deficit: Secondary | ICD-10-CM | POA: Diagnosis not present

## 2023-07-01 DIAGNOSIS — M6281 Muscle weakness (generalized): Secondary | ICD-10-CM | POA: Diagnosis not present

## 2023-07-01 DIAGNOSIS — R262 Difficulty in walking, not elsewhere classified: Secondary | ICD-10-CM | POA: Diagnosis not present

## 2023-07-05 DIAGNOSIS — R41841 Cognitive communication deficit: Secondary | ICD-10-CM | POA: Diagnosis not present

## 2023-07-05 DIAGNOSIS — M6281 Muscle weakness (generalized): Secondary | ICD-10-CM | POA: Diagnosis not present

## 2023-07-06 DIAGNOSIS — M6281 Muscle weakness (generalized): Secondary | ICD-10-CM | POA: Diagnosis not present

## 2023-07-06 DIAGNOSIS — R262 Difficulty in walking, not elsewhere classified: Secondary | ICD-10-CM | POA: Diagnosis not present

## 2023-07-06 DIAGNOSIS — I10 Essential (primary) hypertension: Secondary | ICD-10-CM | POA: Diagnosis not present

## 2023-07-07 DIAGNOSIS — M6281 Muscle weakness (generalized): Secondary | ICD-10-CM | POA: Diagnosis not present

## 2023-07-07 DIAGNOSIS — R41841 Cognitive communication deficit: Secondary | ICD-10-CM | POA: Diagnosis not present

## 2023-07-07 DIAGNOSIS — I10 Essential (primary) hypertension: Secondary | ICD-10-CM | POA: Diagnosis not present

## 2023-07-08 DIAGNOSIS — M6281 Muscle weakness (generalized): Secondary | ICD-10-CM | POA: Diagnosis not present

## 2023-07-08 DIAGNOSIS — R262 Difficulty in walking, not elsewhere classified: Secondary | ICD-10-CM | POA: Diagnosis not present

## 2023-07-09 DIAGNOSIS — M6281 Muscle weakness (generalized): Secondary | ICD-10-CM | POA: Diagnosis not present

## 2023-07-09 DIAGNOSIS — R41841 Cognitive communication deficit: Secondary | ICD-10-CM | POA: Diagnosis not present

## 2023-07-09 DIAGNOSIS — I10 Essential (primary) hypertension: Secondary | ICD-10-CM | POA: Diagnosis not present

## 2023-07-12 DIAGNOSIS — M6281 Muscle weakness (generalized): Secondary | ICD-10-CM | POA: Diagnosis not present

## 2023-07-12 DIAGNOSIS — R262 Difficulty in walking, not elsewhere classified: Secondary | ICD-10-CM | POA: Diagnosis not present

## 2023-07-12 DIAGNOSIS — R41841 Cognitive communication deficit: Secondary | ICD-10-CM | POA: Diagnosis not present

## 2023-07-14 DIAGNOSIS — M6281 Muscle weakness (generalized): Secondary | ICD-10-CM | POA: Diagnosis not present

## 2023-07-14 DIAGNOSIS — R41841 Cognitive communication deficit: Secondary | ICD-10-CM | POA: Diagnosis not present

## 2023-07-14 DIAGNOSIS — I10 Essential (primary) hypertension: Secondary | ICD-10-CM | POA: Diagnosis not present

## 2023-07-15 DIAGNOSIS — M6281 Muscle weakness (generalized): Secondary | ICD-10-CM | POA: Diagnosis not present

## 2023-07-15 DIAGNOSIS — R262 Difficulty in walking, not elsewhere classified: Secondary | ICD-10-CM | POA: Diagnosis not present

## 2023-07-15 DIAGNOSIS — R41841 Cognitive communication deficit: Secondary | ICD-10-CM | POA: Diagnosis not present

## 2023-07-16 DIAGNOSIS — I10 Essential (primary) hypertension: Secondary | ICD-10-CM | POA: Diagnosis not present

## 2023-07-16 DIAGNOSIS — M6281 Muscle weakness (generalized): Secondary | ICD-10-CM | POA: Diagnosis not present

## 2023-07-19 DIAGNOSIS — M6281 Muscle weakness (generalized): Secondary | ICD-10-CM | POA: Diagnosis not present

## 2023-07-19 DIAGNOSIS — R262 Difficulty in walking, not elsewhere classified: Secondary | ICD-10-CM | POA: Diagnosis not present

## 2023-07-19 DIAGNOSIS — R41841 Cognitive communication deficit: Secondary | ICD-10-CM | POA: Diagnosis not present

## 2023-07-20 DIAGNOSIS — M6281 Muscle weakness (generalized): Secondary | ICD-10-CM | POA: Diagnosis not present

## 2023-07-20 DIAGNOSIS — I10 Essential (primary) hypertension: Secondary | ICD-10-CM | POA: Diagnosis not present

## 2023-07-21 DIAGNOSIS — M6281 Muscle weakness (generalized): Secondary | ICD-10-CM | POA: Diagnosis not present

## 2023-07-21 DIAGNOSIS — R41841 Cognitive communication deficit: Secondary | ICD-10-CM | POA: Diagnosis not present

## 2023-07-22 DIAGNOSIS — R262 Difficulty in walking, not elsewhere classified: Secondary | ICD-10-CM | POA: Diagnosis not present

## 2023-07-22 DIAGNOSIS — M6281 Muscle weakness (generalized): Secondary | ICD-10-CM | POA: Diagnosis not present

## 2023-07-23 DIAGNOSIS — R41841 Cognitive communication deficit: Secondary | ICD-10-CM | POA: Diagnosis not present

## 2023-07-23 DIAGNOSIS — I10 Essential (primary) hypertension: Secondary | ICD-10-CM | POA: Diagnosis not present

## 2023-07-23 DIAGNOSIS — M6281 Muscle weakness (generalized): Secondary | ICD-10-CM | POA: Diagnosis not present

## 2023-07-26 DIAGNOSIS — R41841 Cognitive communication deficit: Secondary | ICD-10-CM | POA: Diagnosis not present

## 2023-07-26 DIAGNOSIS — M6281 Muscle weakness (generalized): Secondary | ICD-10-CM | POA: Diagnosis not present

## 2023-07-26 DIAGNOSIS — R262 Difficulty in walking, not elsewhere classified: Secondary | ICD-10-CM | POA: Diagnosis not present

## 2023-07-27 DIAGNOSIS — I10 Essential (primary) hypertension: Secondary | ICD-10-CM | POA: Diagnosis not present

## 2023-07-27 DIAGNOSIS — M6281 Muscle weakness (generalized): Secondary | ICD-10-CM | POA: Diagnosis not present

## 2023-07-27 DIAGNOSIS — R41841 Cognitive communication deficit: Secondary | ICD-10-CM | POA: Diagnosis not present

## 2023-07-28 DIAGNOSIS — R41841 Cognitive communication deficit: Secondary | ICD-10-CM | POA: Diagnosis not present

## 2023-07-28 DIAGNOSIS — M6281 Muscle weakness (generalized): Secondary | ICD-10-CM | POA: Diagnosis not present

## 2023-07-28 DIAGNOSIS — R262 Difficulty in walking, not elsewhere classified: Secondary | ICD-10-CM | POA: Diagnosis not present

## 2023-07-30 DIAGNOSIS — I10 Essential (primary) hypertension: Secondary | ICD-10-CM | POA: Diagnosis not present

## 2023-07-30 DIAGNOSIS — M6281 Muscle weakness (generalized): Secondary | ICD-10-CM | POA: Diagnosis not present

## 2023-08-02 DIAGNOSIS — R41841 Cognitive communication deficit: Secondary | ICD-10-CM | POA: Diagnosis not present

## 2023-08-02 DIAGNOSIS — M6281 Muscle weakness (generalized): Secondary | ICD-10-CM | POA: Diagnosis not present

## 2023-08-02 DIAGNOSIS — R262 Difficulty in walking, not elsewhere classified: Secondary | ICD-10-CM | POA: Diagnosis not present

## 2023-08-03 DIAGNOSIS — I10 Essential (primary) hypertension: Secondary | ICD-10-CM | POA: Diagnosis not present

## 2023-08-03 DIAGNOSIS — M6281 Muscle weakness (generalized): Secondary | ICD-10-CM | POA: Diagnosis not present

## 2023-08-04 DIAGNOSIS — M6281 Muscle weakness (generalized): Secondary | ICD-10-CM | POA: Diagnosis not present

## 2023-08-04 DIAGNOSIS — R262 Difficulty in walking, not elsewhere classified: Secondary | ICD-10-CM | POA: Diagnosis not present

## 2023-08-04 DIAGNOSIS — R41841 Cognitive communication deficit: Secondary | ICD-10-CM | POA: Diagnosis not present

## 2023-08-06 DIAGNOSIS — I10 Essential (primary) hypertension: Secondary | ICD-10-CM | POA: Diagnosis not present

## 2023-08-06 DIAGNOSIS — R41841 Cognitive communication deficit: Secondary | ICD-10-CM | POA: Diagnosis not present

## 2023-08-06 DIAGNOSIS — M6281 Muscle weakness (generalized): Secondary | ICD-10-CM | POA: Diagnosis not present

## 2023-08-07 DIAGNOSIS — R262 Difficulty in walking, not elsewhere classified: Secondary | ICD-10-CM | POA: Diagnosis not present

## 2023-08-07 DIAGNOSIS — M6281 Muscle weakness (generalized): Secondary | ICD-10-CM | POA: Diagnosis not present

## 2023-08-09 DIAGNOSIS — R41841 Cognitive communication deficit: Secondary | ICD-10-CM | POA: Diagnosis not present

## 2023-08-09 DIAGNOSIS — M6281 Muscle weakness (generalized): Secondary | ICD-10-CM | POA: Diagnosis not present

## 2023-08-10 DIAGNOSIS — M6281 Muscle weakness (generalized): Secondary | ICD-10-CM | POA: Diagnosis not present

## 2023-08-10 DIAGNOSIS — I10 Essential (primary) hypertension: Secondary | ICD-10-CM | POA: Diagnosis not present

## 2023-08-11 DIAGNOSIS — M6281 Muscle weakness (generalized): Secondary | ICD-10-CM | POA: Diagnosis not present

## 2023-08-11 DIAGNOSIS — R41841 Cognitive communication deficit: Secondary | ICD-10-CM | POA: Diagnosis not present

## 2023-08-12 DIAGNOSIS — I10 Essential (primary) hypertension: Secondary | ICD-10-CM | POA: Diagnosis not present

## 2023-08-12 DIAGNOSIS — M6281 Muscle weakness (generalized): Secondary | ICD-10-CM | POA: Diagnosis not present

## 2023-08-13 DIAGNOSIS — R41841 Cognitive communication deficit: Secondary | ICD-10-CM | POA: Diagnosis not present

## 2023-08-13 DIAGNOSIS — M6281 Muscle weakness (generalized): Secondary | ICD-10-CM | POA: Diagnosis not present

## 2023-08-16 DIAGNOSIS — R262 Difficulty in walking, not elsewhere classified: Secondary | ICD-10-CM | POA: Diagnosis not present

## 2023-08-16 DIAGNOSIS — R41841 Cognitive communication deficit: Secondary | ICD-10-CM | POA: Diagnosis not present

## 2023-08-16 DIAGNOSIS — M6281 Muscle weakness (generalized): Secondary | ICD-10-CM | POA: Diagnosis not present

## 2023-08-17 DIAGNOSIS — R262 Difficulty in walking, not elsewhere classified: Secondary | ICD-10-CM | POA: Diagnosis not present

## 2023-08-17 DIAGNOSIS — I10 Essential (primary) hypertension: Secondary | ICD-10-CM | POA: Diagnosis not present

## 2023-08-17 DIAGNOSIS — M6281 Muscle weakness (generalized): Secondary | ICD-10-CM | POA: Diagnosis not present

## 2023-08-18 DIAGNOSIS — M6281 Muscle weakness (generalized): Secondary | ICD-10-CM | POA: Diagnosis not present

## 2023-08-18 DIAGNOSIS — R262 Difficulty in walking, not elsewhere classified: Secondary | ICD-10-CM | POA: Diagnosis not present

## 2023-08-18 DIAGNOSIS — R41841 Cognitive communication deficit: Secondary | ICD-10-CM | POA: Diagnosis not present

## 2023-08-19 DIAGNOSIS — I10 Essential (primary) hypertension: Secondary | ICD-10-CM | POA: Diagnosis not present

## 2023-08-19 DIAGNOSIS — M6281 Muscle weakness (generalized): Secondary | ICD-10-CM | POA: Diagnosis not present

## 2023-08-20 DIAGNOSIS — R41841 Cognitive communication deficit: Secondary | ICD-10-CM | POA: Diagnosis not present

## 2023-08-20 DIAGNOSIS — M6281 Muscle weakness (generalized): Secondary | ICD-10-CM | POA: Diagnosis not present

## 2023-08-23 DIAGNOSIS — M6281 Muscle weakness (generalized): Secondary | ICD-10-CM | POA: Diagnosis not present

## 2023-08-23 DIAGNOSIS — R262 Difficulty in walking, not elsewhere classified: Secondary | ICD-10-CM | POA: Diagnosis not present

## 2023-08-23 DIAGNOSIS — R41841 Cognitive communication deficit: Secondary | ICD-10-CM | POA: Diagnosis not present

## 2023-08-24 DIAGNOSIS — M6281 Muscle weakness (generalized): Secondary | ICD-10-CM | POA: Diagnosis not present

## 2023-08-24 DIAGNOSIS — I10 Essential (primary) hypertension: Secondary | ICD-10-CM | POA: Diagnosis not present

## 2023-08-25 DIAGNOSIS — R262 Difficulty in walking, not elsewhere classified: Secondary | ICD-10-CM | POA: Diagnosis not present

## 2023-08-25 DIAGNOSIS — M6281 Muscle weakness (generalized): Secondary | ICD-10-CM | POA: Diagnosis not present

## 2023-08-25 DIAGNOSIS — R41841 Cognitive communication deficit: Secondary | ICD-10-CM | POA: Diagnosis not present

## 2023-08-26 DIAGNOSIS — M6281 Muscle weakness (generalized): Secondary | ICD-10-CM | POA: Diagnosis not present

## 2023-08-26 DIAGNOSIS — I10 Essential (primary) hypertension: Secondary | ICD-10-CM | POA: Diagnosis not present

## 2023-08-27 DIAGNOSIS — I10 Essential (primary) hypertension: Secondary | ICD-10-CM | POA: Diagnosis not present

## 2023-08-27 DIAGNOSIS — M6281 Muscle weakness (generalized): Secondary | ICD-10-CM | POA: Diagnosis not present

## 2023-08-27 DIAGNOSIS — R262 Difficulty in walking, not elsewhere classified: Secondary | ICD-10-CM | POA: Diagnosis not present

## 2023-08-27 DIAGNOSIS — R41841 Cognitive communication deficit: Secondary | ICD-10-CM | POA: Diagnosis not present

## 2023-09-06 DIAGNOSIS — R41841 Cognitive communication deficit: Secondary | ICD-10-CM | POA: Diagnosis not present

## 2023-09-06 DIAGNOSIS — R262 Difficulty in walking, not elsewhere classified: Secondary | ICD-10-CM | POA: Diagnosis not present

## 2023-09-06 DIAGNOSIS — M6281 Muscle weakness (generalized): Secondary | ICD-10-CM | POA: Diagnosis not present

## 2023-09-07 DIAGNOSIS — M6281 Muscle weakness (generalized): Secondary | ICD-10-CM | POA: Diagnosis not present

## 2023-09-07 DIAGNOSIS — R262 Difficulty in walking, not elsewhere classified: Secondary | ICD-10-CM | POA: Diagnosis not present

## 2023-09-08 DIAGNOSIS — M6281 Muscle weakness (generalized): Secondary | ICD-10-CM | POA: Diagnosis not present

## 2023-09-08 DIAGNOSIS — R41841 Cognitive communication deficit: Secondary | ICD-10-CM | POA: Diagnosis not present

## 2023-09-09 DIAGNOSIS — M6281 Muscle weakness (generalized): Secondary | ICD-10-CM | POA: Diagnosis not present

## 2023-09-09 DIAGNOSIS — R262 Difficulty in walking, not elsewhere classified: Secondary | ICD-10-CM | POA: Diagnosis not present

## 2023-09-10 DIAGNOSIS — M6281 Muscle weakness (generalized): Secondary | ICD-10-CM | POA: Diagnosis not present

## 2023-09-10 DIAGNOSIS — R41841 Cognitive communication deficit: Secondary | ICD-10-CM | POA: Diagnosis not present

## 2023-09-13 DIAGNOSIS — F0390 Unspecified dementia without behavioral disturbance: Secondary | ICD-10-CM | POA: Diagnosis not present

## 2023-09-13 DIAGNOSIS — R42 Dizziness and giddiness: Secondary | ICD-10-CM | POA: Diagnosis not present

## 2023-09-13 DIAGNOSIS — D8989 Other specified disorders involving the immune mechanism, not elsewhere classified: Secondary | ICD-10-CM | POA: Diagnosis not present

## 2023-09-13 DIAGNOSIS — E785 Hyperlipidemia, unspecified: Secondary | ICD-10-CM | POA: Diagnosis not present

## 2023-09-13 DIAGNOSIS — R413 Other amnesia: Secondary | ICD-10-CM | POA: Diagnosis not present

## 2023-09-13 DIAGNOSIS — E119 Type 2 diabetes mellitus without complications: Secondary | ICD-10-CM | POA: Diagnosis not present

## 2023-09-13 DIAGNOSIS — M6281 Muscle weakness (generalized): Secondary | ICD-10-CM | POA: Diagnosis not present

## 2023-09-13 DIAGNOSIS — R41841 Cognitive communication deficit: Secondary | ICD-10-CM | POA: Diagnosis not present

## 2023-09-13 DIAGNOSIS — I10 Essential (primary) hypertension: Secondary | ICD-10-CM | POA: Diagnosis not present

## 2023-09-13 DIAGNOSIS — M810 Age-related osteoporosis without current pathological fracture: Secondary | ICD-10-CM | POA: Diagnosis not present

## 2023-09-13 DIAGNOSIS — M069 Rheumatoid arthritis, unspecified: Secondary | ICD-10-CM | POA: Diagnosis not present

## 2023-09-14 DIAGNOSIS — M6281 Muscle weakness (generalized): Secondary | ICD-10-CM | POA: Diagnosis not present

## 2023-09-14 DIAGNOSIS — R262 Difficulty in walking, not elsewhere classified: Secondary | ICD-10-CM | POA: Diagnosis not present

## 2023-09-15 DIAGNOSIS — R41841 Cognitive communication deficit: Secondary | ICD-10-CM | POA: Diagnosis not present

## 2023-09-15 DIAGNOSIS — M6281 Muscle weakness (generalized): Secondary | ICD-10-CM | POA: Diagnosis not present

## 2023-09-16 DIAGNOSIS — R262 Difficulty in walking, not elsewhere classified: Secondary | ICD-10-CM | POA: Diagnosis not present

## 2023-09-16 DIAGNOSIS — M6281 Muscle weakness (generalized): Secondary | ICD-10-CM | POA: Diagnosis not present

## 2023-09-17 DIAGNOSIS — M6281 Muscle weakness (generalized): Secondary | ICD-10-CM | POA: Diagnosis not present

## 2023-09-17 DIAGNOSIS — R41841 Cognitive communication deficit: Secondary | ICD-10-CM | POA: Diagnosis not present

## 2023-09-20 DIAGNOSIS — R41841 Cognitive communication deficit: Secondary | ICD-10-CM | POA: Diagnosis not present

## 2023-09-20 DIAGNOSIS — M6281 Muscle weakness (generalized): Secondary | ICD-10-CM | POA: Diagnosis not present

## 2023-09-21 DIAGNOSIS — R262 Difficulty in walking, not elsewhere classified: Secondary | ICD-10-CM | POA: Diagnosis not present

## 2023-09-21 DIAGNOSIS — M6281 Muscle weakness (generalized): Secondary | ICD-10-CM | POA: Diagnosis not present

## 2023-09-22 DIAGNOSIS — R41841 Cognitive communication deficit: Secondary | ICD-10-CM | POA: Diagnosis not present

## 2023-09-22 DIAGNOSIS — M6281 Muscle weakness (generalized): Secondary | ICD-10-CM | POA: Diagnosis not present

## 2023-09-23 DIAGNOSIS — M6281 Muscle weakness (generalized): Secondary | ICD-10-CM | POA: Diagnosis not present

## 2023-09-23 DIAGNOSIS — R262 Difficulty in walking, not elsewhere classified: Secondary | ICD-10-CM | POA: Diagnosis not present

## 2023-09-26 DIAGNOSIS — M6281 Muscle weakness (generalized): Secondary | ICD-10-CM | POA: Diagnosis not present

## 2023-09-26 DIAGNOSIS — R262 Difficulty in walking, not elsewhere classified: Secondary | ICD-10-CM | POA: Diagnosis not present

## 2023-09-27 DIAGNOSIS — R41841 Cognitive communication deficit: Secondary | ICD-10-CM | POA: Diagnosis not present

## 2023-09-27 DIAGNOSIS — M6281 Muscle weakness (generalized): Secondary | ICD-10-CM | POA: Diagnosis not present

## 2023-09-28 DIAGNOSIS — R41841 Cognitive communication deficit: Secondary | ICD-10-CM | POA: Diagnosis not present

## 2023-09-28 DIAGNOSIS — M6281 Muscle weakness (generalized): Secondary | ICD-10-CM | POA: Diagnosis not present

## 2023-09-29 DIAGNOSIS — M6281 Muscle weakness (generalized): Secondary | ICD-10-CM | POA: Diagnosis not present

## 2023-09-29 DIAGNOSIS — R41841 Cognitive communication deficit: Secondary | ICD-10-CM | POA: Diagnosis not present

## 2023-10-04 DIAGNOSIS — R41841 Cognitive communication deficit: Secondary | ICD-10-CM | POA: Diagnosis not present

## 2023-10-04 DIAGNOSIS — M6281 Muscle weakness (generalized): Secondary | ICD-10-CM | POA: Diagnosis not present

## 2023-10-04 DIAGNOSIS — R262 Difficulty in walking, not elsewhere classified: Secondary | ICD-10-CM | POA: Diagnosis not present

## 2023-10-05 DIAGNOSIS — M2351 Chronic instability of knee, right knee: Secondary | ICD-10-CM | POA: Diagnosis not present

## 2023-10-05 DIAGNOSIS — M545 Low back pain, unspecified: Secondary | ICD-10-CM | POA: Diagnosis not present

## 2023-10-05 DIAGNOSIS — M2352 Chronic instability of knee, left knee: Secondary | ICD-10-CM | POA: Diagnosis not present

## 2023-10-06 DIAGNOSIS — R262 Difficulty in walking, not elsewhere classified: Secondary | ICD-10-CM | POA: Diagnosis not present

## 2023-10-06 DIAGNOSIS — M6281 Muscle weakness (generalized): Secondary | ICD-10-CM | POA: Diagnosis not present

## 2023-10-07 DIAGNOSIS — R41841 Cognitive communication deficit: Secondary | ICD-10-CM | POA: Diagnosis not present

## 2023-10-07 DIAGNOSIS — M6281 Muscle weakness (generalized): Secondary | ICD-10-CM | POA: Diagnosis not present

## 2023-10-11 DIAGNOSIS — R41841 Cognitive communication deficit: Secondary | ICD-10-CM | POA: Diagnosis not present

## 2023-10-11 DIAGNOSIS — M6281 Muscle weakness (generalized): Secondary | ICD-10-CM | POA: Diagnosis not present

## 2023-10-12 DIAGNOSIS — R262 Difficulty in walking, not elsewhere classified: Secondary | ICD-10-CM | POA: Diagnosis not present

## 2023-10-12 DIAGNOSIS — M6281 Muscle weakness (generalized): Secondary | ICD-10-CM | POA: Diagnosis not present

## 2023-10-13 DIAGNOSIS — R262 Difficulty in walking, not elsewhere classified: Secondary | ICD-10-CM | POA: Diagnosis not present

## 2023-10-13 DIAGNOSIS — M6281 Muscle weakness (generalized): Secondary | ICD-10-CM | POA: Diagnosis not present

## 2023-10-13 DIAGNOSIS — R41841 Cognitive communication deficit: Secondary | ICD-10-CM | POA: Diagnosis not present

## 2023-10-14 DIAGNOSIS — R41841 Cognitive communication deficit: Secondary | ICD-10-CM | POA: Diagnosis not present

## 2023-10-14 DIAGNOSIS — M6281 Muscle weakness (generalized): Secondary | ICD-10-CM | POA: Diagnosis not present

## 2023-10-18 DIAGNOSIS — R41841 Cognitive communication deficit: Secondary | ICD-10-CM | POA: Diagnosis not present

## 2023-10-18 DIAGNOSIS — M6281 Muscle weakness (generalized): Secondary | ICD-10-CM | POA: Diagnosis not present

## 2023-10-19 DIAGNOSIS — M6281 Muscle weakness (generalized): Secondary | ICD-10-CM | POA: Diagnosis not present

## 2023-10-19 DIAGNOSIS — R262 Difficulty in walking, not elsewhere classified: Secondary | ICD-10-CM | POA: Diagnosis not present

## 2023-10-20 DIAGNOSIS — M6281 Muscle weakness (generalized): Secondary | ICD-10-CM | POA: Diagnosis not present

## 2023-10-20 DIAGNOSIS — R41841 Cognitive communication deficit: Secondary | ICD-10-CM | POA: Diagnosis not present

## 2023-10-21 DIAGNOSIS — M6281 Muscle weakness (generalized): Secondary | ICD-10-CM | POA: Diagnosis not present

## 2023-10-21 DIAGNOSIS — R262 Difficulty in walking, not elsewhere classified: Secondary | ICD-10-CM | POA: Diagnosis not present

## 2023-10-25 DIAGNOSIS — R41841 Cognitive communication deficit: Secondary | ICD-10-CM | POA: Diagnosis not present

## 2023-10-25 DIAGNOSIS — M6281 Muscle weakness (generalized): Secondary | ICD-10-CM | POA: Diagnosis not present

## 2023-10-26 DIAGNOSIS — M6281 Muscle weakness (generalized): Secondary | ICD-10-CM | POA: Diagnosis not present

## 2023-10-26 DIAGNOSIS — R262 Difficulty in walking, not elsewhere classified: Secondary | ICD-10-CM | POA: Diagnosis not present

## 2023-10-27 DIAGNOSIS — R41841 Cognitive communication deficit: Secondary | ICD-10-CM | POA: Diagnosis not present

## 2023-10-27 DIAGNOSIS — M6281 Muscle weakness (generalized): Secondary | ICD-10-CM | POA: Diagnosis not present

## 2023-10-28 DIAGNOSIS — M6281 Muscle weakness (generalized): Secondary | ICD-10-CM | POA: Diagnosis not present

## 2023-10-28 DIAGNOSIS — R262 Difficulty in walking, not elsewhere classified: Secondary | ICD-10-CM | POA: Diagnosis not present

## 2023-10-29 DIAGNOSIS — M6281 Muscle weakness (generalized): Secondary | ICD-10-CM | POA: Diagnosis not present

## 2023-10-29 DIAGNOSIS — R41841 Cognitive communication deficit: Secondary | ICD-10-CM | POA: Diagnosis not present

## 2023-11-01 DIAGNOSIS — R41841 Cognitive communication deficit: Secondary | ICD-10-CM | POA: Diagnosis not present

## 2023-11-01 DIAGNOSIS — M6281 Muscle weakness (generalized): Secondary | ICD-10-CM | POA: Diagnosis not present

## 2023-11-02 DIAGNOSIS — R41841 Cognitive communication deficit: Secondary | ICD-10-CM | POA: Diagnosis not present

## 2023-11-02 DIAGNOSIS — M6281 Muscle weakness (generalized): Secondary | ICD-10-CM | POA: Diagnosis not present

## 2023-11-03 DIAGNOSIS — M6281 Muscle weakness (generalized): Secondary | ICD-10-CM | POA: Diagnosis not present

## 2023-11-03 DIAGNOSIS — R262 Difficulty in walking, not elsewhere classified: Secondary | ICD-10-CM | POA: Diagnosis not present

## 2023-11-04 DIAGNOSIS — R262 Difficulty in walking, not elsewhere classified: Secondary | ICD-10-CM | POA: Diagnosis not present

## 2023-11-04 DIAGNOSIS — R41841 Cognitive communication deficit: Secondary | ICD-10-CM | POA: Diagnosis not present

## 2023-11-04 DIAGNOSIS — M6281 Muscle weakness (generalized): Secondary | ICD-10-CM | POA: Diagnosis not present

## 2023-11-08 DIAGNOSIS — M6281 Muscle weakness (generalized): Secondary | ICD-10-CM | POA: Diagnosis not present

## 2023-11-08 DIAGNOSIS — R41841 Cognitive communication deficit: Secondary | ICD-10-CM | POA: Diagnosis not present

## 2023-11-09 DIAGNOSIS — R41841 Cognitive communication deficit: Secondary | ICD-10-CM | POA: Diagnosis not present

## 2023-11-09 DIAGNOSIS — R262 Difficulty in walking, not elsewhere classified: Secondary | ICD-10-CM | POA: Diagnosis not present

## 2023-11-09 DIAGNOSIS — M6281 Muscle weakness (generalized): Secondary | ICD-10-CM | POA: Diagnosis not present

## 2023-11-10 DIAGNOSIS — R41841 Cognitive communication deficit: Secondary | ICD-10-CM | POA: Diagnosis not present

## 2023-11-10 DIAGNOSIS — M6281 Muscle weakness (generalized): Secondary | ICD-10-CM | POA: Diagnosis not present

## 2023-11-11 DIAGNOSIS — R262 Difficulty in walking, not elsewhere classified: Secondary | ICD-10-CM | POA: Diagnosis not present

## 2023-11-11 DIAGNOSIS — M6281 Muscle weakness (generalized): Secondary | ICD-10-CM | POA: Diagnosis not present

## 2023-11-15 DIAGNOSIS — M6281 Muscle weakness (generalized): Secondary | ICD-10-CM | POA: Diagnosis not present

## 2023-11-15 DIAGNOSIS — R41841 Cognitive communication deficit: Secondary | ICD-10-CM | POA: Diagnosis not present

## 2023-11-16 DIAGNOSIS — R262 Difficulty in walking, not elsewhere classified: Secondary | ICD-10-CM | POA: Diagnosis not present

## 2023-11-16 DIAGNOSIS — M6281 Muscle weakness (generalized): Secondary | ICD-10-CM | POA: Diagnosis not present

## 2023-11-17 DIAGNOSIS — M6281 Muscle weakness (generalized): Secondary | ICD-10-CM | POA: Diagnosis not present

## 2023-11-17 DIAGNOSIS — R41841 Cognitive communication deficit: Secondary | ICD-10-CM | POA: Diagnosis not present

## 2023-11-19 DIAGNOSIS — M6281 Muscle weakness (generalized): Secondary | ICD-10-CM | POA: Diagnosis not present

## 2023-11-19 DIAGNOSIS — R41841 Cognitive communication deficit: Secondary | ICD-10-CM | POA: Diagnosis not present

## 2023-11-22 DIAGNOSIS — R41841 Cognitive communication deficit: Secondary | ICD-10-CM | POA: Diagnosis not present

## 2023-11-22 DIAGNOSIS — R262 Difficulty in walking, not elsewhere classified: Secondary | ICD-10-CM | POA: Diagnosis not present

## 2023-11-22 DIAGNOSIS — M6281 Muscle weakness (generalized): Secondary | ICD-10-CM | POA: Diagnosis not present

## 2023-11-23 DIAGNOSIS — R262 Difficulty in walking, not elsewhere classified: Secondary | ICD-10-CM | POA: Diagnosis not present

## 2023-11-23 DIAGNOSIS — M6281 Muscle weakness (generalized): Secondary | ICD-10-CM | POA: Diagnosis not present

## 2023-11-24 DIAGNOSIS — M6281 Muscle weakness (generalized): Secondary | ICD-10-CM | POA: Diagnosis not present

## 2023-11-24 DIAGNOSIS — R41841 Cognitive communication deficit: Secondary | ICD-10-CM | POA: Diagnosis not present

## 2023-11-25 DIAGNOSIS — M6281 Muscle weakness (generalized): Secondary | ICD-10-CM | POA: Diagnosis not present

## 2023-11-25 DIAGNOSIS — R262 Difficulty in walking, not elsewhere classified: Secondary | ICD-10-CM | POA: Diagnosis not present

## 2023-11-29 DIAGNOSIS — M6281 Muscle weakness (generalized): Secondary | ICD-10-CM | POA: Diagnosis not present

## 2023-11-29 DIAGNOSIS — R41841 Cognitive communication deficit: Secondary | ICD-10-CM | POA: Diagnosis not present

## 2023-11-30 DIAGNOSIS — R262 Difficulty in walking, not elsewhere classified: Secondary | ICD-10-CM | POA: Diagnosis not present

## 2023-11-30 DIAGNOSIS — M6281 Muscle weakness (generalized): Secondary | ICD-10-CM | POA: Diagnosis not present

## 2023-11-30 DIAGNOSIS — R41841 Cognitive communication deficit: Secondary | ICD-10-CM | POA: Diagnosis not present

## 2023-12-01 DIAGNOSIS — M6281 Muscle weakness (generalized): Secondary | ICD-10-CM | POA: Diagnosis not present

## 2023-12-01 DIAGNOSIS — R262 Difficulty in walking, not elsewhere classified: Secondary | ICD-10-CM | POA: Diagnosis not present

## 2023-12-02 DIAGNOSIS — R41841 Cognitive communication deficit: Secondary | ICD-10-CM | POA: Diagnosis not present

## 2023-12-02 DIAGNOSIS — M6281 Muscle weakness (generalized): Secondary | ICD-10-CM | POA: Diagnosis not present

## 2023-12-03 DIAGNOSIS — R41841 Cognitive communication deficit: Secondary | ICD-10-CM | POA: Diagnosis not present

## 2023-12-03 DIAGNOSIS — M6281 Muscle weakness (generalized): Secondary | ICD-10-CM | POA: Diagnosis not present

## 2023-12-07 DIAGNOSIS — M6281 Muscle weakness (generalized): Secondary | ICD-10-CM | POA: Diagnosis not present

## 2023-12-07 DIAGNOSIS — R262 Difficulty in walking, not elsewhere classified: Secondary | ICD-10-CM | POA: Diagnosis not present

## 2023-12-09 DIAGNOSIS — R41841 Cognitive communication deficit: Secondary | ICD-10-CM | POA: Diagnosis not present

## 2023-12-09 DIAGNOSIS — R262 Difficulty in walking, not elsewhere classified: Secondary | ICD-10-CM | POA: Diagnosis not present

## 2023-12-09 DIAGNOSIS — M6281 Muscle weakness (generalized): Secondary | ICD-10-CM | POA: Diagnosis not present

## 2023-12-13 DIAGNOSIS — R41841 Cognitive communication deficit: Secondary | ICD-10-CM | POA: Diagnosis not present

## 2023-12-13 DIAGNOSIS — M6281 Muscle weakness (generalized): Secondary | ICD-10-CM | POA: Diagnosis not present

## 2023-12-14 DIAGNOSIS — M6281 Muscle weakness (generalized): Secondary | ICD-10-CM | POA: Diagnosis not present

## 2023-12-14 DIAGNOSIS — R262 Difficulty in walking, not elsewhere classified: Secondary | ICD-10-CM | POA: Diagnosis not present

## 2023-12-15 DIAGNOSIS — R41841 Cognitive communication deficit: Secondary | ICD-10-CM | POA: Diagnosis not present

## 2023-12-15 DIAGNOSIS — M6281 Muscle weakness (generalized): Secondary | ICD-10-CM | POA: Diagnosis not present

## 2023-12-16 DIAGNOSIS — R41841 Cognitive communication deficit: Secondary | ICD-10-CM | POA: Diagnosis not present

## 2023-12-16 DIAGNOSIS — M6281 Muscle weakness (generalized): Secondary | ICD-10-CM | POA: Diagnosis not present

## 2023-12-17 DIAGNOSIS — R41841 Cognitive communication deficit: Secondary | ICD-10-CM | POA: Diagnosis not present

## 2023-12-17 DIAGNOSIS — M6281 Muscle weakness (generalized): Secondary | ICD-10-CM | POA: Diagnosis not present

## 2023-12-20 DIAGNOSIS — M6281 Muscle weakness (generalized): Secondary | ICD-10-CM | POA: Diagnosis not present

## 2023-12-20 DIAGNOSIS — R262 Difficulty in walking, not elsewhere classified: Secondary | ICD-10-CM | POA: Diagnosis not present

## 2023-12-21 DIAGNOSIS — M6281 Muscle weakness (generalized): Secondary | ICD-10-CM | POA: Diagnosis not present

## 2023-12-21 DIAGNOSIS — R41841 Cognitive communication deficit: Secondary | ICD-10-CM | POA: Diagnosis not present

## 2023-12-21 DIAGNOSIS — R262 Difficulty in walking, not elsewhere classified: Secondary | ICD-10-CM | POA: Diagnosis not present

## 2023-12-23 DIAGNOSIS — R41841 Cognitive communication deficit: Secondary | ICD-10-CM | POA: Diagnosis not present

## 2023-12-23 DIAGNOSIS — M6281 Muscle weakness (generalized): Secondary | ICD-10-CM | POA: Diagnosis not present

## 2023-12-24 DIAGNOSIS — R41841 Cognitive communication deficit: Secondary | ICD-10-CM | POA: Diagnosis not present

## 2023-12-24 DIAGNOSIS — M6281 Muscle weakness (generalized): Secondary | ICD-10-CM | POA: Diagnosis not present

## 2023-12-24 DIAGNOSIS — R262 Difficulty in walking, not elsewhere classified: Secondary | ICD-10-CM | POA: Diagnosis not present

## 2023-12-27 DIAGNOSIS — R262 Difficulty in walking, not elsewhere classified: Secondary | ICD-10-CM | POA: Diagnosis not present

## 2023-12-27 DIAGNOSIS — M6281 Muscle weakness (generalized): Secondary | ICD-10-CM | POA: Diagnosis not present

## 2023-12-28 DIAGNOSIS — R41841 Cognitive communication deficit: Secondary | ICD-10-CM | POA: Diagnosis not present

## 2023-12-28 DIAGNOSIS — M6281 Muscle weakness (generalized): Secondary | ICD-10-CM | POA: Diagnosis not present

## 2023-12-29 DIAGNOSIS — R262 Difficulty in walking, not elsewhere classified: Secondary | ICD-10-CM | POA: Diagnosis not present

## 2023-12-29 DIAGNOSIS — M6281 Muscle weakness (generalized): Secondary | ICD-10-CM | POA: Diagnosis not present

## 2023-12-30 DIAGNOSIS — R41841 Cognitive communication deficit: Secondary | ICD-10-CM | POA: Diagnosis not present

## 2023-12-30 DIAGNOSIS — M6281 Muscle weakness (generalized): Secondary | ICD-10-CM | POA: Diagnosis not present

## 2023-12-31 DIAGNOSIS — R41841 Cognitive communication deficit: Secondary | ICD-10-CM | POA: Diagnosis not present

## 2023-12-31 DIAGNOSIS — M6281 Muscle weakness (generalized): Secondary | ICD-10-CM | POA: Diagnosis not present

## 2024-01-04 DIAGNOSIS — R41841 Cognitive communication deficit: Secondary | ICD-10-CM | POA: Diagnosis not present

## 2024-01-04 DIAGNOSIS — M6281 Muscle weakness (generalized): Secondary | ICD-10-CM | POA: Diagnosis not present

## 2024-01-04 DIAGNOSIS — R262 Difficulty in walking, not elsewhere classified: Secondary | ICD-10-CM | POA: Diagnosis not present

## 2024-01-06 DIAGNOSIS — R262 Difficulty in walking, not elsewhere classified: Secondary | ICD-10-CM | POA: Diagnosis not present

## 2024-01-06 DIAGNOSIS — R41841 Cognitive communication deficit: Secondary | ICD-10-CM | POA: Diagnosis not present

## 2024-01-06 DIAGNOSIS — M6281 Muscle weakness (generalized): Secondary | ICD-10-CM | POA: Diagnosis not present

## 2024-01-07 DIAGNOSIS — M6281 Muscle weakness (generalized): Secondary | ICD-10-CM | POA: Diagnosis not present

## 2024-01-07 DIAGNOSIS — R41841 Cognitive communication deficit: Secondary | ICD-10-CM | POA: Diagnosis not present

## 2024-01-11 DIAGNOSIS — M6281 Muscle weakness (generalized): Secondary | ICD-10-CM | POA: Diagnosis not present

## 2024-01-11 DIAGNOSIS — R41841 Cognitive communication deficit: Secondary | ICD-10-CM | POA: Diagnosis not present

## 2024-01-11 DIAGNOSIS — R262 Difficulty in walking, not elsewhere classified: Secondary | ICD-10-CM | POA: Diagnosis not present

## 2024-01-12 DIAGNOSIS — Z20828 Contact with and (suspected) exposure to other viral communicable diseases: Secondary | ICD-10-CM | POA: Diagnosis not present

## 2024-01-13 DIAGNOSIS — M6281 Muscle weakness (generalized): Secondary | ICD-10-CM | POA: Diagnosis not present

## 2024-01-13 DIAGNOSIS — R41841 Cognitive communication deficit: Secondary | ICD-10-CM | POA: Diagnosis not present

## 2024-01-13 DIAGNOSIS — R262 Difficulty in walking, not elsewhere classified: Secondary | ICD-10-CM | POA: Diagnosis not present

## 2024-01-14 DIAGNOSIS — M6281 Muscle weakness (generalized): Secondary | ICD-10-CM | POA: Diagnosis not present

## 2024-01-14 DIAGNOSIS — R41841 Cognitive communication deficit: Secondary | ICD-10-CM | POA: Diagnosis not present

## 2024-01-17 DIAGNOSIS — I10 Essential (primary) hypertension: Secondary | ICD-10-CM | POA: Diagnosis not present

## 2024-01-17 DIAGNOSIS — E7849 Other hyperlipidemia: Secondary | ICD-10-CM | POA: Diagnosis not present

## 2024-01-17 DIAGNOSIS — E559 Vitamin D deficiency, unspecified: Secondary | ICD-10-CM | POA: Diagnosis not present

## 2024-01-17 DIAGNOSIS — Z1389 Encounter for screening for other disorder: Secondary | ICD-10-CM | POA: Diagnosis not present

## 2024-01-17 DIAGNOSIS — E119 Type 2 diabetes mellitus without complications: Secondary | ICD-10-CM | POA: Diagnosis not present

## 2024-01-17 DIAGNOSIS — E785 Hyperlipidemia, unspecified: Secondary | ICD-10-CM | POA: Diagnosis not present

## 2024-01-18 DIAGNOSIS — M6281 Muscle weakness (generalized): Secondary | ICD-10-CM | POA: Diagnosis not present

## 2024-01-18 DIAGNOSIS — R41841 Cognitive communication deficit: Secondary | ICD-10-CM | POA: Diagnosis not present

## 2024-01-18 DIAGNOSIS — R262 Difficulty in walking, not elsewhere classified: Secondary | ICD-10-CM | POA: Diagnosis not present

## 2024-01-20 DIAGNOSIS — R41841 Cognitive communication deficit: Secondary | ICD-10-CM | POA: Diagnosis not present

## 2024-01-20 DIAGNOSIS — M6281 Muscle weakness (generalized): Secondary | ICD-10-CM | POA: Diagnosis not present

## 2024-01-20 DIAGNOSIS — R262 Difficulty in walking, not elsewhere classified: Secondary | ICD-10-CM | POA: Diagnosis not present

## 2024-01-21 DIAGNOSIS — R41841 Cognitive communication deficit: Secondary | ICD-10-CM | POA: Diagnosis not present

## 2024-01-21 DIAGNOSIS — M6281 Muscle weakness (generalized): Secondary | ICD-10-CM | POA: Diagnosis not present

## 2024-01-24 DIAGNOSIS — M069 Rheumatoid arthritis, unspecified: Secondary | ICD-10-CM | POA: Diagnosis not present

## 2024-01-24 DIAGNOSIS — D8989 Other specified disorders involving the immune mechanism, not elsewhere classified: Secondary | ICD-10-CM | POA: Diagnosis not present

## 2024-01-24 DIAGNOSIS — Z0000 Encounter for general adult medical examination without abnormal findings: Secondary | ICD-10-CM | POA: Diagnosis not present

## 2024-01-24 DIAGNOSIS — I10 Essential (primary) hypertension: Secondary | ICD-10-CM | POA: Diagnosis not present

## 2024-01-24 DIAGNOSIS — R262 Difficulty in walking, not elsewhere classified: Secondary | ICD-10-CM | POA: Diagnosis not present

## 2024-01-24 DIAGNOSIS — Z1331 Encounter for screening for depression: Secondary | ICD-10-CM | POA: Diagnosis not present

## 2024-01-24 DIAGNOSIS — M6281 Muscle weakness (generalized): Secondary | ICD-10-CM | POA: Diagnosis not present

## 2024-01-24 DIAGNOSIS — E119 Type 2 diabetes mellitus without complications: Secondary | ICD-10-CM | POA: Diagnosis not present

## 2024-01-24 DIAGNOSIS — F0390 Unspecified dementia without behavioral disturbance: Secondary | ICD-10-CM | POA: Diagnosis not present

## 2024-01-24 DIAGNOSIS — E785 Hyperlipidemia, unspecified: Secondary | ICD-10-CM | POA: Diagnosis not present

## 2024-01-24 DIAGNOSIS — E669 Obesity, unspecified: Secondary | ICD-10-CM | POA: Diagnosis not present

## 2024-01-25 DIAGNOSIS — R41841 Cognitive communication deficit: Secondary | ICD-10-CM | POA: Diagnosis not present

## 2024-01-25 DIAGNOSIS — M6281 Muscle weakness (generalized): Secondary | ICD-10-CM | POA: Diagnosis not present

## 2024-01-27 DIAGNOSIS — M6281 Muscle weakness (generalized): Secondary | ICD-10-CM | POA: Diagnosis not present

## 2024-01-27 DIAGNOSIS — R41841 Cognitive communication deficit: Secondary | ICD-10-CM | POA: Diagnosis not present

## 2024-01-27 DIAGNOSIS — R262 Difficulty in walking, not elsewhere classified: Secondary | ICD-10-CM | POA: Diagnosis not present

## 2024-01-28 DIAGNOSIS — R41841 Cognitive communication deficit: Secondary | ICD-10-CM | POA: Diagnosis not present

## 2024-01-28 DIAGNOSIS — M6281 Muscle weakness (generalized): Secondary | ICD-10-CM | POA: Diagnosis not present

## 2024-02-01 DIAGNOSIS — M6281 Muscle weakness (generalized): Secondary | ICD-10-CM | POA: Diagnosis not present

## 2024-02-01 DIAGNOSIS — R41841 Cognitive communication deficit: Secondary | ICD-10-CM | POA: Diagnosis not present

## 2024-02-01 DIAGNOSIS — R262 Difficulty in walking, not elsewhere classified: Secondary | ICD-10-CM | POA: Diagnosis not present

## 2024-02-02 DIAGNOSIS — M79641 Pain in right hand: Secondary | ICD-10-CM | POA: Diagnosis not present

## 2024-02-02 DIAGNOSIS — G5601 Carpal tunnel syndrome, right upper limb: Secondary | ICD-10-CM | POA: Diagnosis not present

## 2024-02-03 DIAGNOSIS — R262 Difficulty in walking, not elsewhere classified: Secondary | ICD-10-CM | POA: Diagnosis not present

## 2024-02-03 DIAGNOSIS — R41841 Cognitive communication deficit: Secondary | ICD-10-CM | POA: Diagnosis not present

## 2024-02-03 DIAGNOSIS — M6281 Muscle weakness (generalized): Secondary | ICD-10-CM | POA: Diagnosis not present

## 2024-02-04 DIAGNOSIS — M6281 Muscle weakness (generalized): Secondary | ICD-10-CM | POA: Diagnosis not present

## 2024-02-04 DIAGNOSIS — R41841 Cognitive communication deficit: Secondary | ICD-10-CM | POA: Diagnosis not present

## 2024-02-08 DIAGNOSIS — R262 Difficulty in walking, not elsewhere classified: Secondary | ICD-10-CM | POA: Diagnosis not present

## 2024-02-08 DIAGNOSIS — M6281 Muscle weakness (generalized): Secondary | ICD-10-CM | POA: Diagnosis not present

## 2024-02-08 DIAGNOSIS — R41841 Cognitive communication deficit: Secondary | ICD-10-CM | POA: Diagnosis not present

## 2024-02-10 DIAGNOSIS — R41841 Cognitive communication deficit: Secondary | ICD-10-CM | POA: Diagnosis not present

## 2024-02-10 DIAGNOSIS — M6281 Muscle weakness (generalized): Secondary | ICD-10-CM | POA: Diagnosis not present

## 2024-02-11 DIAGNOSIS — M6281 Muscle weakness (generalized): Secondary | ICD-10-CM | POA: Diagnosis not present

## 2024-02-11 DIAGNOSIS — R41841 Cognitive communication deficit: Secondary | ICD-10-CM | POA: Diagnosis not present

## 2024-02-11 DIAGNOSIS — R262 Difficulty in walking, not elsewhere classified: Secondary | ICD-10-CM | POA: Diagnosis not present

## 2024-02-13 DIAGNOSIS — M6281 Muscle weakness (generalized): Secondary | ICD-10-CM | POA: Diagnosis not present

## 2024-02-13 DIAGNOSIS — R262 Difficulty in walking, not elsewhere classified: Secondary | ICD-10-CM | POA: Diagnosis not present

## 2024-02-15 DIAGNOSIS — R41841 Cognitive communication deficit: Secondary | ICD-10-CM | POA: Diagnosis not present

## 2024-02-15 DIAGNOSIS — M6281 Muscle weakness (generalized): Secondary | ICD-10-CM | POA: Diagnosis not present

## 2024-02-17 DIAGNOSIS — R41841 Cognitive communication deficit: Secondary | ICD-10-CM | POA: Diagnosis not present

## 2024-02-17 DIAGNOSIS — R262 Difficulty in walking, not elsewhere classified: Secondary | ICD-10-CM | POA: Diagnosis not present

## 2024-02-17 DIAGNOSIS — M6281 Muscle weakness (generalized): Secondary | ICD-10-CM | POA: Diagnosis not present

## 2024-02-18 DIAGNOSIS — M6281 Muscle weakness (generalized): Secondary | ICD-10-CM | POA: Diagnosis not present

## 2024-02-20 DIAGNOSIS — M6281 Muscle weakness (generalized): Secondary | ICD-10-CM | POA: Diagnosis not present

## 2024-02-20 DIAGNOSIS — R262 Difficulty in walking, not elsewhere classified: Secondary | ICD-10-CM | POA: Diagnosis not present

## 2024-02-22 DIAGNOSIS — R41841 Cognitive communication deficit: Secondary | ICD-10-CM | POA: Diagnosis not present

## 2024-02-22 DIAGNOSIS — M6281 Muscle weakness (generalized): Secondary | ICD-10-CM | POA: Diagnosis not present

## 2024-02-23 DIAGNOSIS — R262 Difficulty in walking, not elsewhere classified: Secondary | ICD-10-CM | POA: Diagnosis not present

## 2024-02-23 DIAGNOSIS — R41841 Cognitive communication deficit: Secondary | ICD-10-CM | POA: Diagnosis not present

## 2024-02-23 DIAGNOSIS — M6281 Muscle weakness (generalized): Secondary | ICD-10-CM | POA: Diagnosis not present

## 2024-02-25 DIAGNOSIS — R41841 Cognitive communication deficit: Secondary | ICD-10-CM | POA: Diagnosis not present

## 2024-02-25 DIAGNOSIS — M6281 Muscle weakness (generalized): Secondary | ICD-10-CM | POA: Diagnosis not present

## 2024-02-28 DIAGNOSIS — M6281 Muscle weakness (generalized): Secondary | ICD-10-CM | POA: Diagnosis not present

## 2024-02-28 DIAGNOSIS — R262 Difficulty in walking, not elsewhere classified: Secondary | ICD-10-CM | POA: Diagnosis not present

## 2024-02-28 IMAGING — DX DG KNEE 1-2V PORT*L*
2 series · 2 of 2 positions shown · non-contrast
Comparison: None Available.

CLINICAL DATA: Left knee pain.

EXAM:
PORTABLE LEFT KNEE - 1-2 VIEW

[knee ap]
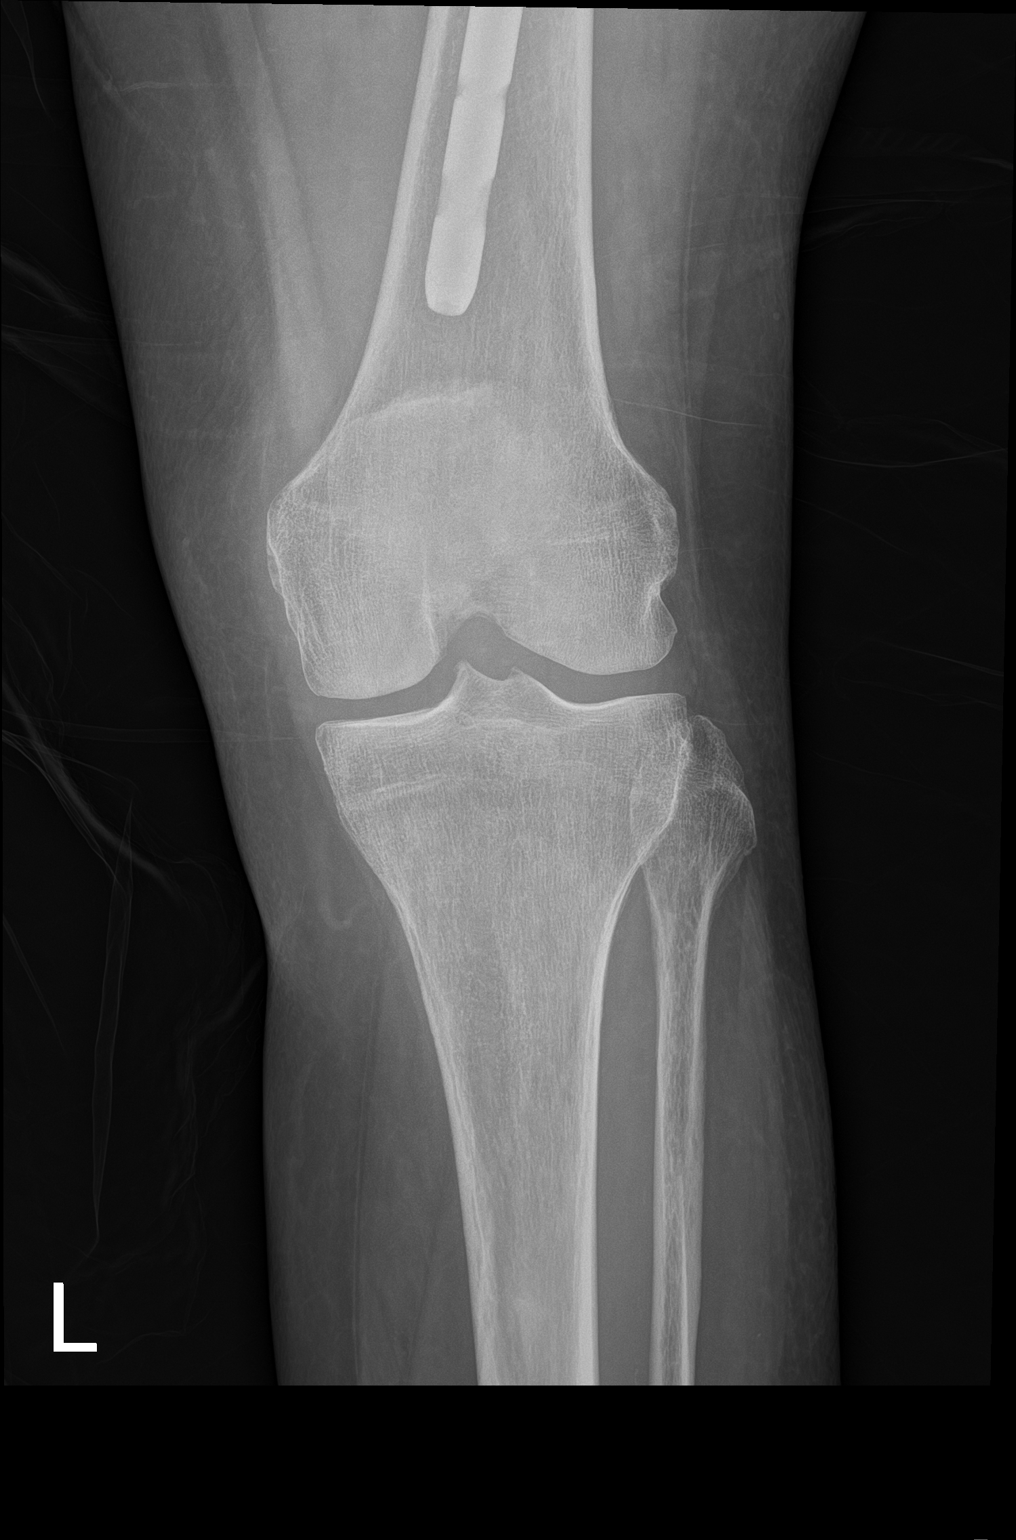

[knee lat]
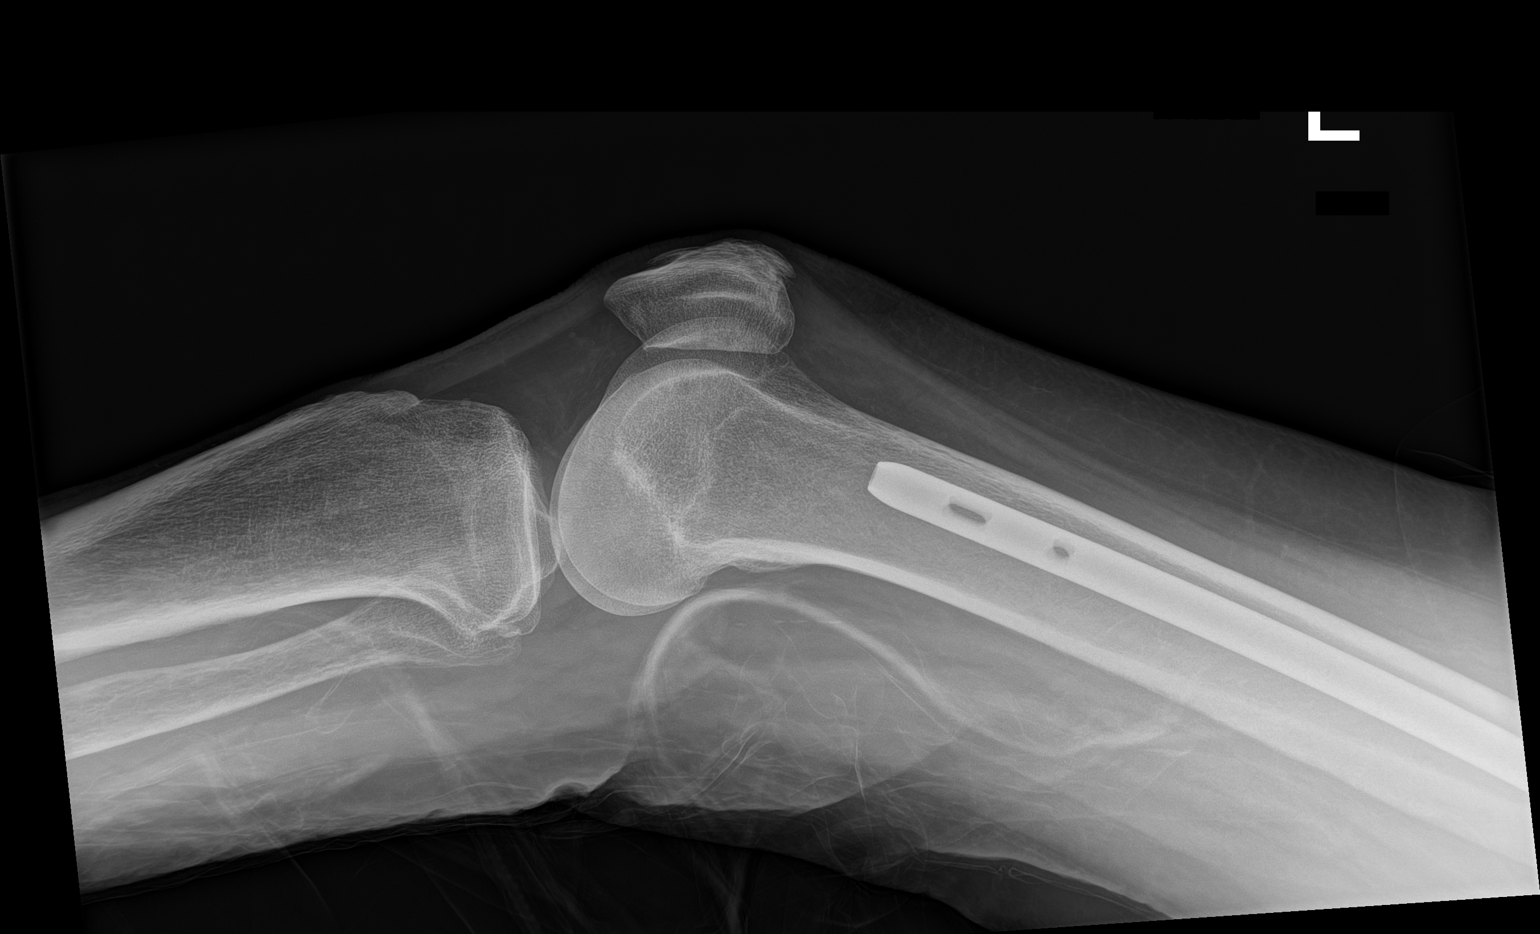

[2 of 2 positions shown; findings below may reference images not displayed]

FINDINGS: Two views of left knee demonstrate an intramedullary nail in the
distal left femur. The left knee is located without a fracture. No
joint effusion. Enthesopathic changes along the anterior aspect of
the patella. Minimal spurring involving the patellofemoral
compartment of the knee.
IMPRESSION: No acute bone abnormality in left knee.

## 2024-02-29 DIAGNOSIS — R41841 Cognitive communication deficit: Secondary | ICD-10-CM | POA: Diagnosis not present

## 2024-02-29 DIAGNOSIS — M6281 Muscle weakness (generalized): Secondary | ICD-10-CM | POA: Diagnosis not present

## 2024-03-01 DIAGNOSIS — R41841 Cognitive communication deficit: Secondary | ICD-10-CM | POA: Diagnosis not present

## 2024-03-01 DIAGNOSIS — M6281 Muscle weakness (generalized): Secondary | ICD-10-CM | POA: Diagnosis not present

## 2024-03-03 DIAGNOSIS — R262 Difficulty in walking, not elsewhere classified: Secondary | ICD-10-CM | POA: Diagnosis not present

## 2024-03-03 DIAGNOSIS — M6281 Muscle weakness (generalized): Secondary | ICD-10-CM | POA: Diagnosis not present
